# Patient Record
Sex: Female | Born: 1935 | Race: White | Hispanic: No | Smoking: Never smoker
Health system: Southern US, Community
[De-identification: ages and names within clinical notes are randomized; demographics above are authoritative.]

## PROBLEM LIST (undated history)

## (undated) DIAGNOSIS — E7800 Pure hypercholesterolemia, unspecified: Secondary | ICD-10-CM

## (undated) DIAGNOSIS — I10 Essential (primary) hypertension: Secondary | ICD-10-CM

## (undated) DIAGNOSIS — R51 Headache: Secondary | ICD-10-CM

## (undated) DIAGNOSIS — R519 Headache, unspecified: Secondary | ICD-10-CM

## (undated) DIAGNOSIS — C801 Malignant (primary) neoplasm, unspecified: Secondary | ICD-10-CM

## (undated) DIAGNOSIS — J302 Other seasonal allergic rhinitis: Secondary | ICD-10-CM

## (undated) DIAGNOSIS — F329 Major depressive disorder, single episode, unspecified: Secondary | ICD-10-CM

## (undated) DIAGNOSIS — R252 Cramp and spasm: Secondary | ICD-10-CM

## (undated) DIAGNOSIS — H9190 Unspecified hearing loss, unspecified ear: Secondary | ICD-10-CM

## (undated) DIAGNOSIS — Z973 Presence of spectacles and contact lenses: Secondary | ICD-10-CM

## (undated) DIAGNOSIS — F32A Depression, unspecified: Secondary | ICD-10-CM

## (undated) HISTORY — PX: FRACTURE SURGERY: SHX138

## (undated) HISTORY — PX: BREAST SURGERY: SHX581

## (undated) HISTORY — PX: CATARACT EXTRACTION W/ INTRAOCULAR LENS IMPLANT: SHX1309

## (undated) HISTORY — PX: TONSILLECTOMY: SUR1361

---

## 2014-03-29 ENCOUNTER — Encounter (HOSPITAL_COMMUNITY): Payer: Self-pay | Admitting: Pharmacy Technician

## 2014-03-31 ENCOUNTER — Ambulatory Visit (HOSPITAL_COMMUNITY)
Admission: RE | Admit: 2014-03-31 | Discharge: 2014-03-31 | Disposition: A | Discharge status: Home / Self Care | Payer: Medicare Other | Source: Ambulatory Visit | Attending: Orthopedic Surgery | Admitting: Orthopedic Surgery

## 2014-03-31 ENCOUNTER — Encounter (HOSPITAL_COMMUNITY): Payer: Self-pay

## 2014-03-31 ENCOUNTER — Encounter (HOSPITAL_COMMUNITY)
Admission: RE | Admit: 2014-03-31 | Discharge: 2014-03-31 | Disposition: A | Discharge status: Home / Self Care | Payer: Medicare Other | Source: Ambulatory Visit | Attending: Orthopaedic Surgery | Admitting: Orthopaedic Surgery

## 2014-03-31 DIAGNOSIS — M1611 Unilateral primary osteoarthritis, right hip: Secondary | ICD-10-CM | POA: Diagnosis not present

## 2014-03-31 DIAGNOSIS — R8290 Unspecified abnormal findings in urine: Secondary | ICD-10-CM | POA: Diagnosis not present

## 2014-03-31 DIAGNOSIS — Z01818 Encounter for other preprocedural examination: Secondary | ICD-10-CM | POA: Diagnosis present

## 2014-03-31 DIAGNOSIS — I10 Essential (primary) hypertension: Secondary | ICD-10-CM | POA: Insufficient documentation

## 2014-03-31 HISTORY — DX: Malignant (primary) neoplasm, unspecified: C80.1

## 2014-03-31 HISTORY — DX: Headache: R51

## 2014-03-31 HISTORY — DX: Depression, unspecified: F32.A

## 2014-03-31 HISTORY — DX: Essential (primary) hypertension: I10

## 2014-03-31 HISTORY — DX: Other seasonal allergic rhinitis: J30.2

## 2014-03-31 HISTORY — DX: Pure hypercholesterolemia, unspecified: E78.00

## 2014-03-31 HISTORY — DX: Unspecified hearing loss, unspecified ear: H91.90

## 2014-03-31 HISTORY — DX: Major depressive disorder, single episode, unspecified: F32.9

## 2014-03-31 HISTORY — DX: Headache, unspecified: R51.9

## 2014-03-31 HISTORY — DX: Presence of spectacles and contact lenses: Z97.3

## 2014-03-31 LAB — CBC WITH DIFFERENTIAL/PLATELET
BASOS ABS: 0 10*3/uL (ref 0.0–0.1)
BASOS PCT: 0 % (ref 0–1)
Eosinophils Absolute: 0.3 10*3/uL (ref 0.0–0.7)
Eosinophils Relative: 4 % (ref 0–5)
HCT: 40.3 % (ref 36.0–46.0)
HEMOGLOBIN: 13.5 g/dL (ref 12.0–15.0)
Lymphocytes Relative: 19 % (ref 12–46)
Lymphs Abs: 1.4 10*3/uL (ref 0.7–4.0)
MCH: 31.1 pg (ref 26.0–34.0)
MCHC: 33.5 g/dL (ref 30.0–36.0)
MCV: 92.9 fL (ref 78.0–100.0)
Monocytes Absolute: 0.7 10*3/uL (ref 0.1–1.0)
Monocytes Relative: 10 % (ref 3–12)
NEUTROS ABS: 4.8 10*3/uL (ref 1.7–7.7)
Neutrophils Relative %: 67 % (ref 43–77)
Platelets: 289 10*3/uL (ref 150–400)
RBC: 4.34 MIL/uL (ref 3.87–5.11)
RDW: 12.7 % (ref 11.5–15.5)
WBC: 7.1 10*3/uL (ref 4.0–10.5)

## 2014-03-31 LAB — TYPE AND SCREEN
ABO/RH(D): A POS
Antibody Screen: NEGATIVE

## 2014-03-31 LAB — URINALYSIS, ROUTINE W REFLEX MICROSCOPIC
Bilirubin Urine: NEGATIVE
GLUCOSE, UA: NEGATIVE mg/dL
HGB URINE DIPSTICK: NEGATIVE
Ketones, ur: NEGATIVE mg/dL
Nitrite: NEGATIVE
PH: 5.5 (ref 5.0–8.0)
PROTEIN: NEGATIVE mg/dL
Specific Gravity, Urine: 1.023 (ref 1.005–1.030)
Urobilinogen, UA: 1 mg/dL (ref 0.0–1.0)

## 2014-03-31 LAB — SURGICAL PCR SCREEN
MRSA, PCR: NEGATIVE
Staphylococcus aureus: NEGATIVE

## 2014-03-31 LAB — COMPREHENSIVE METABOLIC PANEL
ALK PHOS: 62 U/L (ref 39–117)
ALT: 13 U/L (ref 0–35)
ANION GAP: 15 (ref 5–15)
AST: 20 U/L (ref 0–37)
Albumin: 4.3 g/dL (ref 3.5–5.2)
BILIRUBIN TOTAL: 0.3 mg/dL (ref 0.3–1.2)
BUN: 31 mg/dL — ABNORMAL HIGH (ref 6–23)
CO2: 24 mEq/L (ref 19–32)
Calcium: 9.9 mg/dL (ref 8.4–10.5)
Chloride: 106 mEq/L (ref 96–112)
Creatinine, Ser: 0.57 mg/dL (ref 0.50–1.10)
GFR calc Af Amer: >90 mL/min (ref >90)
GFR calc non Af Amer: 81 mL/min — ABNORMAL LOW (ref >90)
Glucose, Bld: 94 mg/dL (ref 70–99)
POTASSIUM: 4 meq/L (ref 3.7–5.3)
Sodium: 145 mEq/L (ref 137–147)
TOTAL PROTEIN: 7.4 g/dL (ref 6.0–8.3)

## 2014-03-31 LAB — PROTIME-INR
INR: 1.01 (ref 0.00–1.49)
Prothrombin Time: 13.4 seconds (ref 11.6–15.2)

## 2014-03-31 LAB — APTT: APTT: 30 s (ref 24–37)

## 2014-03-31 LAB — URINE MICROSCOPIC-ADD ON

## 2014-03-31 LAB — ABO/RH: ABO/RH(D): A POS

## 2014-03-31 IMAGING — CR DG CHEST 2V
2 series · 2 of 2 positions shown · non-contrast
Comparison: None.

CLINICAL DATA: Preop total right hip arthroplasty. History of
hypertension.

EXAM:
CHEST  2 VIEW

[w chest pa]
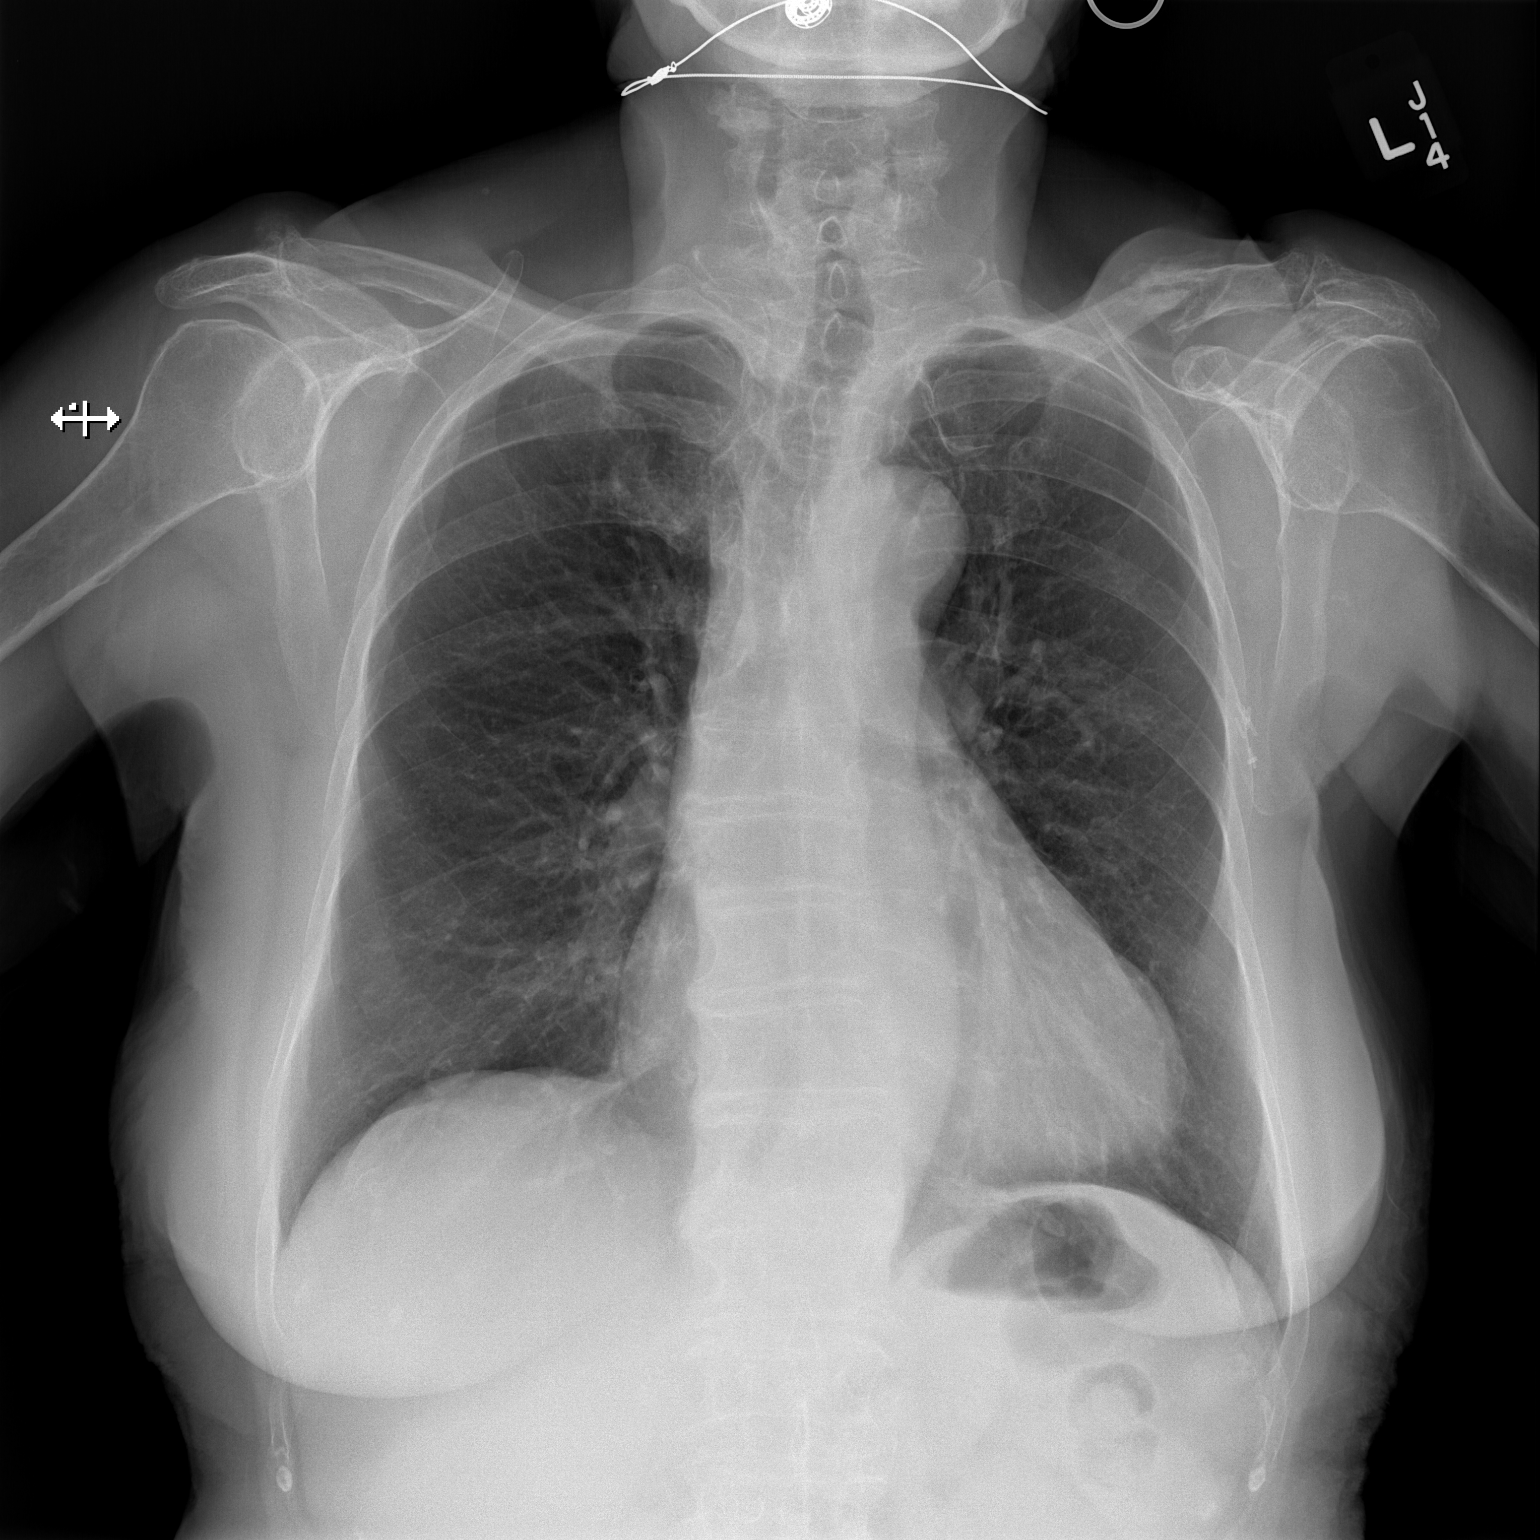

[w chest lat]
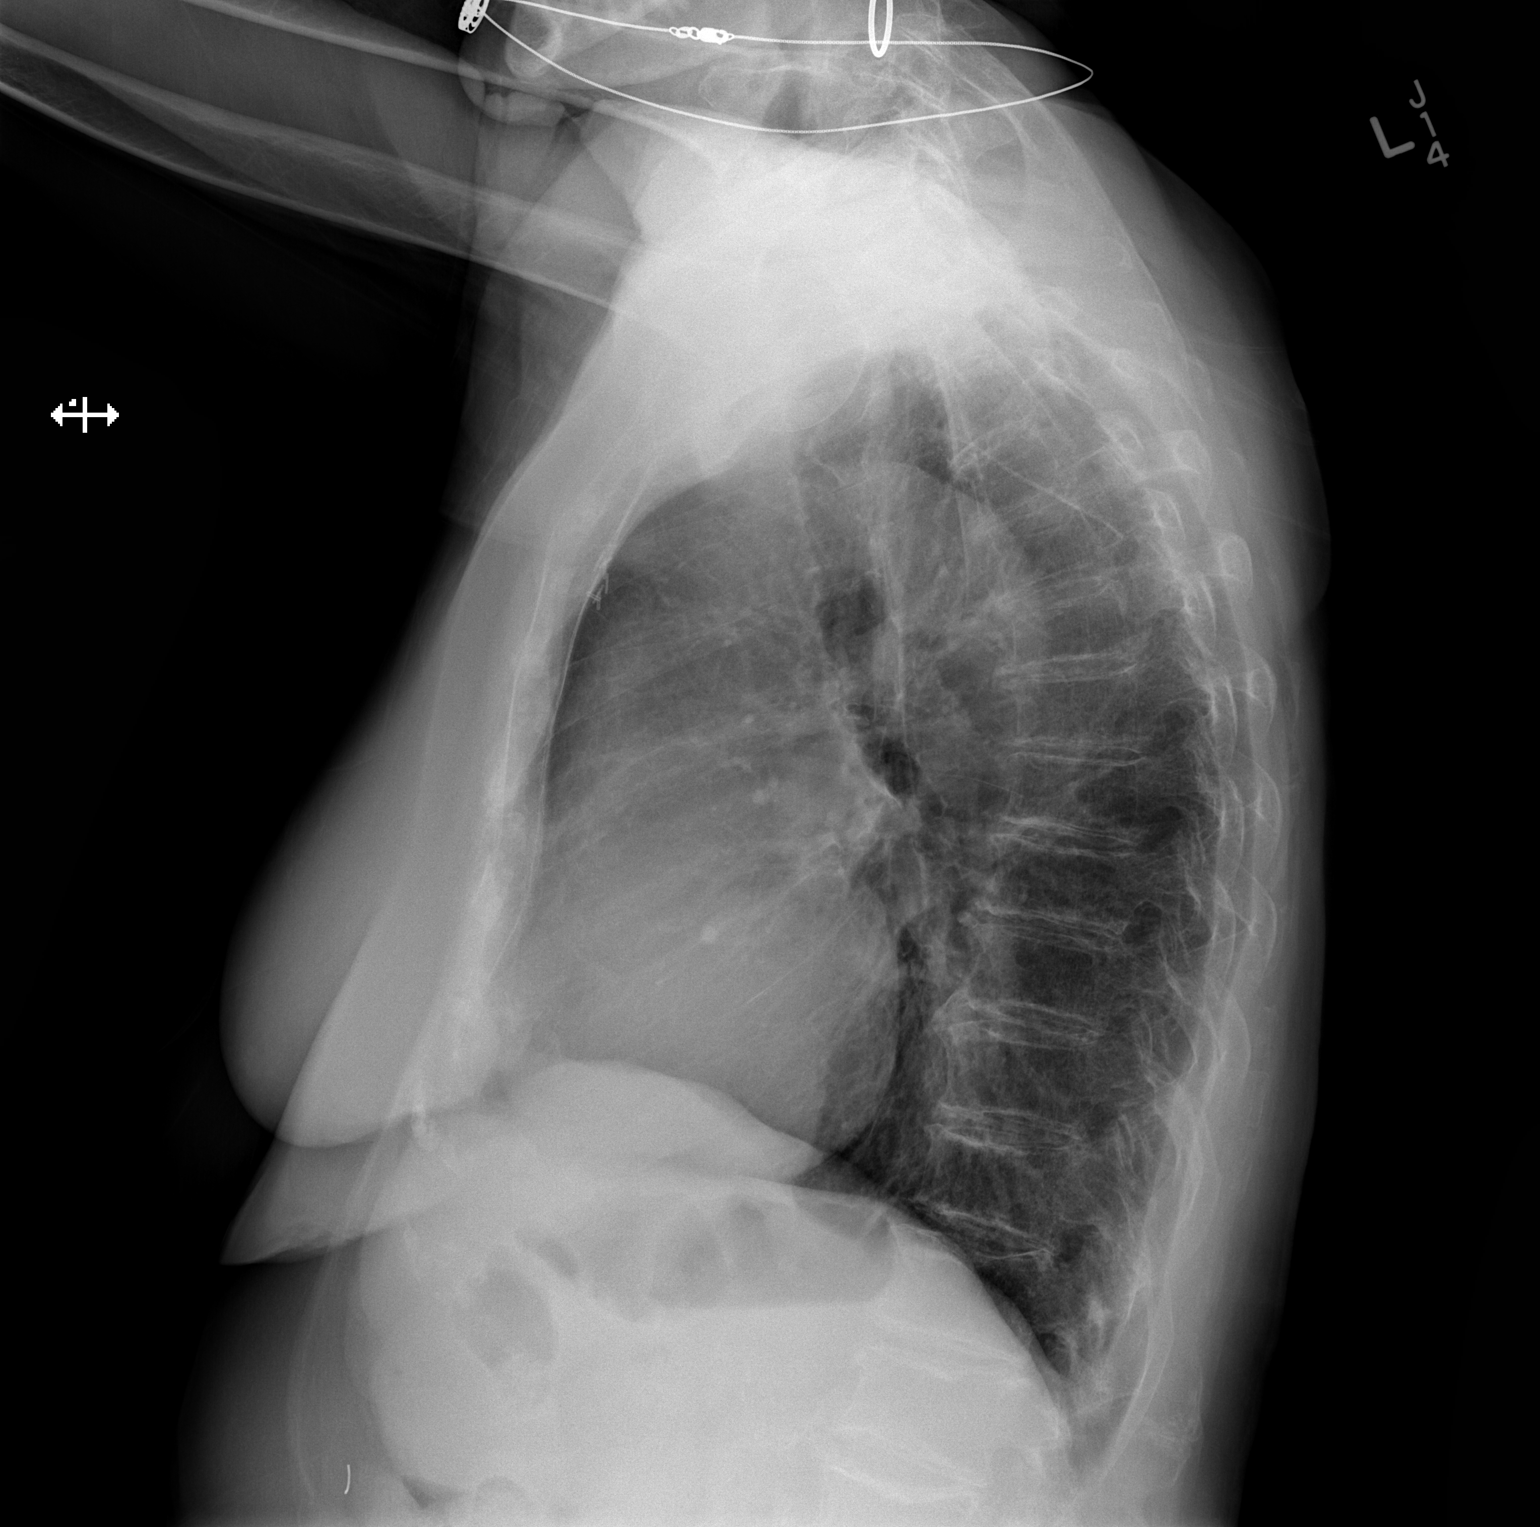

[2 of 2 positions shown; findings below may reference images not displayed]

FINDINGS: Heart is mildly enlarged. Lungs are clear. No effusions or edema. No
acute bony abnormality. Old left clavicle fracture. Degenerative
changes in the shoulders and lower thoracic spine. Surgical clips in
the left axilla.
IMPRESSION: Cardiomegaly.  No active disease.

## 2014-03-31 NOTE — Pre-Procedure Instructions (Signed)
Salvador Bigbee  03/31/2014   Your procedure is scheduled on: Tuesday, April 06, 2014  Report to [facility] Admitting at 5:30 AM.  Call this number if you have problems the morning of surgery: [phone]   Remember:   Do not eat food or drink liquids after midnight Monday, April 05, 2014   Take these medicines the morning of surgery with A SIP OF WATER: FLUoxetine (PROZAC)  Stop taking Aspirin, , and herbal medications. Do not take any NSAIDs ie: Ibuprofen, Advil, Naproxen or any medication containing Aspirin.   Do not wear jewelry, make-up or nail polish.  Do not wear lotions, powders, or perfumes. You may not wear deodorant.  Do not shave 48 hours prior to surgery.  Do not bring valuables to the hospital.  [facility] is not responsible  for any belongings or valuables.               Contacts, dentures or bridgework may not be worn into surgery.  Leave suitcase in the car. After surgery it may be brought to your room.  For patients admitted to the hospital, discharge time is determined by your treatment team.               Patients discharged the day of surgery will not be allowed to drive home.  Name and phone number of your driver:   Special Instructions:  Special Instructions:Special Instructions: [facility] - Preparing for Surgery  Before surgery, you can play an important role.  Because skin is not sterile, your skin needs to be as free of germs as possible.  You can reduce the number of germs on you skin by washing with CHG (chlorahexidine gluconate) soap before surgery.  CHG is an antiseptic cleaner which kills germs and bonds with the skin to continue killing germs even after washing.  Please DO NOT use if you have an allergy to CHG or antibacterial soaps.  If your skin becomes reddened/irritated stop using the CHG and inform your nurse when you arrive at Short Stay.  Do not shave (including legs and underarms) for at least 48 hours prior to the first CHG  shower.  You may shave your face.  Please follow these instructions carefully:   1.  Shower with CHG Soap the night before surgery and the morning of Surgery.  2.  If you choose to wash your hair, wash your hair first as usual with your normal shampoo.  3.  After you shampoo, rinse your hair and body thoroughly to remove the Shampoo.  4.  Use CHG as you would any other liquid soap.  You can apply chg directly  to the skin and wash gently with scrungie or a clean washcloth.  5.  Apply the CHG Soap to your body ONLY FROM THE NECK DOWN.  Do not use on open wounds or open sores.  Avoid contact with your eyes, ears, mouth and genitals (private parts).  Wash genitals (private parts) with your normal soap.  6.  Wash thoroughly, paying special attention to the area where your surgery will be performed.  7.  Thoroughly rinse your body with warm water from the neck down.  8.  DO NOT shower/wash with your normal soap after using and rinsing off the CHG Soap.  9.  Pat yourself dry with a clean towel.            10.  Wear clean pajamas.  11.  Place clean sheets on your bed the night of your first shower and do not sleep with pets.  Day of Surgery  Do not apply any lotions/deodorants the morning of surgery.  Please wear clean clothes to the hospital/surgery center.   Please read over the following fact sheets that you were given: Pain Booklet, Coughing and Deep Breathing, Blood Transfusion Information, MRSA Information and Surgical Site Infection Prevention
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
## 2014-04-02 LAB — URINE CULTURE
Colony Count: NO GROWTH
Culture: NO GROWTH

## 2014-04-05 MED ORDER — CEFAZOLIN SODIUM-DEXTROSE 2-3 GM-% IV SOLR
2.0000 g | INTRAVENOUS | Status: AC
  Administered 2014-04-06: 2 g via INTRAVENOUS
  Filled 2014-04-05: qty 50

## 2014-04-05 MED ORDER — CHLORHEXIDINE GLUCONATE 4 % EX LIQD
60.0000 mL | Freq: Once | CUTANEOUS | Status: DC
  Filled 2014-04-05: qty 60

## 2014-04-05 MED ORDER — ACETAMINOPHEN 10 MG/ML IV SOLN
1000.0000 mg | Freq: Four times a day (QID) | INTRAVENOUS | Status: DC
Start: 2014-04-06 — End: 2014-04-06
  Administered 2014-04-06: 1000 mg via INTRAVENOUS

## 2014-04-05 NOTE — Progress Notes (Signed)
Anesthesia Chart Review:  Pt is 80 year old female scheduled for R total hip replacement with hardware removal on 04/06/14 with Dr. Durward Fortes.   PMH: HTN, hypercholesterolemia, breast cancer, depression, hard of hearing.   Medications include: ASA, amlodipine, lisinopril, prozac, trazodone  Preoperative labs reviewed.  BUN 31, Cr 0.57  Chest x-ray reviewed. Cardiomegaly. No active disease.  EKG: not done at PAT.    Pt has medical clearance from PCP Lonia Mad in [place], [place]. Notes from his office include documentation of an EKG (but no tracing provided) dated 10/26/2012. Notes state EKG shows L atrial abnormality, RSR prime in V1 or V2 Right VCD or RVH. LVH. Borderline repolarization abnormality.    Pt will need EKG DOS.   Willeen Cass, FNP-BC [facility] Short Stay Surgical Center/Anesthesiology Phone: [phone] 04/05/2014 2:05 PM

## 2014-04-06 ENCOUNTER — Inpatient Hospital Stay (HOSPITAL_COMMUNITY): Payer: Medicare Other

## 2014-04-06 ENCOUNTER — Inpatient Hospital Stay (HOSPITAL_COMMUNITY): Payer: Medicare Other | Admitting: Certified Registered Nurse Anesthetist

## 2014-04-06 ENCOUNTER — Encounter (HOSPITAL_COMMUNITY): Payer: Self-pay | Admitting: *Deleted

## 2014-04-06 ENCOUNTER — Encounter (HOSPITAL_COMMUNITY): Payer: Medicare Other | Admitting: Emergency Medicine

## 2014-04-06 ENCOUNTER — Encounter (HOSPITAL_COMMUNITY)
Admission: RE | Disposition: A | Discharge status: Skilled Nursing Facility | Payer: Self-pay | Source: Ambulatory Visit | Attending: Orthopaedic Surgery

## 2014-04-06 ENCOUNTER — Inpatient Hospital Stay (HOSPITAL_COMMUNITY)
Admission: RE | Admit: 2014-04-06 | Discharge: 2014-04-08 | DRG: 470 | Disposition: A | Discharge status: Skilled Nursing Facility | Payer: Medicare Other | Source: Ambulatory Visit | Attending: Orthopaedic Surgery | Admitting: Orthopaedic Surgery

## 2014-04-06 DIAGNOSIS — Z791 Long term (current) use of non-steroidal anti-inflammatories (NSAID): Secondary | ICD-10-CM

## 2014-04-06 DIAGNOSIS — C50919 Malignant neoplasm of unspecified site of unspecified female breast: Secondary | ICD-10-CM | POA: Diagnosis not present

## 2014-04-06 DIAGNOSIS — Z79899 Other long term (current) drug therapy: Secondary | ICD-10-CM

## 2014-04-06 DIAGNOSIS — Z96649 Presence of unspecified artificial hip joint: Secondary | ICD-10-CM

## 2014-04-06 DIAGNOSIS — Z885 Allergy status to narcotic agent status: Secondary | ICD-10-CM | POA: Diagnosis not present

## 2014-04-06 DIAGNOSIS — Z881 Allergy status to other antibiotic agents status: Secondary | ICD-10-CM

## 2014-04-06 DIAGNOSIS — Z7982 Long term (current) use of aspirin: Secondary | ICD-10-CM

## 2014-04-06 DIAGNOSIS — I10 Essential (primary) hypertension: Secondary | ICD-10-CM | POA: Diagnosis present

## 2014-04-06 DIAGNOSIS — F329 Major depressive disorder, single episode, unspecified: Secondary | ICD-10-CM | POA: Diagnosis present

## 2014-04-06 DIAGNOSIS — Z923 Personal history of irradiation: Secondary | ICD-10-CM

## 2014-04-06 DIAGNOSIS — D62 Acute posthemorrhagic anemia: Secondary | ICD-10-CM | POA: Diagnosis not present

## 2014-04-06 DIAGNOSIS — T8489XA Other specified complication of internal orthopedic prosthetic devices, implants and grafts, initial encounter: Secondary | ICD-10-CM | POA: Diagnosis present

## 2014-04-06 DIAGNOSIS — M1611 Unilateral primary osteoarthritis, right hip: Secondary | ICD-10-CM | POA: Diagnosis not present

## 2014-04-06 DIAGNOSIS — Z853 Personal history of malignant neoplasm of breast: Secondary | ICD-10-CM | POA: Diagnosis not present

## 2014-04-06 DIAGNOSIS — I959 Hypotension, unspecified: Secondary | ICD-10-CM | POA: Diagnosis present

## 2014-04-06 HISTORY — DX: Cramp and spasm: R25.2

## 2014-04-06 HISTORY — PX: TOTAL HIP ARTHROPLASTY: SHX124

## 2014-04-06 HISTORY — PX: HARDWARE REMOVAL: SHX979

## 2014-04-06 SURGERY — ARTHROPLASTY, HIP, TOTAL,POSTERIOR APPROACH
Anesthesia: General | Site: Hip | Laterality: Right

## 2014-04-06 MED ORDER — SODIUM CHLORIDE 0.9 % IR SOLN
Status: DC | PRN
Start: 2014-04-06 — End: 2014-04-06
  Administered 2014-04-06: 1000 mL

## 2014-04-06 MED ORDER — TRAZODONE HCL 50 MG PO TABS
50.0000 mg | ORAL_TABLET | Freq: Every day | ORAL | Status: DC
  Administered 2014-04-07: 50 mg via ORAL
  Filled 2014-04-06 (×3): qty 1

## 2014-04-06 MED ORDER — CEFAZOLIN SODIUM-DEXTROSE 2-3 GM-% IV SOLR
2.0000 g | Freq: Four times a day (QID) | INTRAVENOUS | Status: AC
Start: 2014-04-06 — End: 2014-04-06
  Administered 2014-04-06 (×2): 2 g via INTRAVENOUS
  Filled 2014-04-06 (×2): qty 50

## 2014-04-06 MED ORDER — SODIUM CHLORIDE 0.9 % IV SOLN: INTRAVENOUS | Status: DC

## 2014-04-06 MED ORDER — PHENYLEPHRINE HCL 10 MG/ML IJ SOLN
INTRAMUSCULAR | Status: DC | PRN
  Administered 2014-04-06 (×2): 80 ug via INTRAVENOUS

## 2014-04-06 MED ORDER — FENTANYL CITRATE 0.05 MG/ML IJ SOLN
INTRAMUSCULAR | Status: AC
  Filled 2014-04-06: qty 2

## 2014-04-06 MED ORDER — ACETAMINOPHEN 10 MG/ML IV SOLN
1000.0000 mg | Freq: Four times a day (QID) | INTRAVENOUS | Status: AC
  Administered 2014-04-06 – 2014-04-07 (×4): 1000 mg via INTRAVENOUS
  Filled 2014-04-06 (×4): qty 100

## 2014-04-06 MED ORDER — ARTIFICIAL TEARS OP OINT
TOPICAL_OINTMENT | OPHTHALMIC | Status: AC
  Filled 2014-04-06: qty 3.5

## 2014-04-06 MED ORDER — EPHEDRINE SULFATE 50 MG/ML IJ SOLN
INTRAMUSCULAR | Status: DC | PRN
  Administered 2014-04-06: 5 mg via INTRAVENOUS
  Administered 2014-04-06: 10 mg via INTRAVENOUS
  Administered 2014-04-06: 5 mg via INTRAVENOUS

## 2014-04-06 MED ORDER — KETOROLAC TROMETHAMINE 30 MG/ML IJ SOLN
INTRAMUSCULAR | Status: DC | PRN
  Administered 2014-04-06: 15 mg via INTRAVENOUS

## 2014-04-06 MED ORDER — FENTANYL CITRATE 0.05 MG/ML IJ SOLN
INTRAMUSCULAR | Status: DC | PRN
  Administered 2014-04-06: 100 ug via INTRAVENOUS
  Administered 2014-04-06: 150 ug via INTRAVENOUS
  Administered 2014-04-06: 25 ug via INTRAVENOUS

## 2014-04-06 MED ORDER — PROPOFOL 10 MG/ML IV BOLUS
INTRAVENOUS | Status: AC
Start: 2014-04-06 — End: 2014-04-06
  Filled 2014-04-06: qty 20

## 2014-04-06 MED ORDER — GLYCOPYRROLATE 0.2 MG/ML IJ SOLN
INTRAMUSCULAR | Status: DC | PRN
  Administered 2014-04-06: .4 mg via INTRAVENOUS

## 2014-04-06 MED ORDER — FENTANYL CITRATE 0.05 MG/ML IJ SOLN
INTRAMUSCULAR | Status: AC
  Filled 2014-04-06: qty 5

## 2014-04-06 MED ORDER — METOCLOPRAMIDE HCL 5 MG/ML IJ SOLN: 5.0000 mg | Freq: Three times a day (TID) | INTRAMUSCULAR | Status: DC | PRN

## 2014-04-06 MED ORDER — LACTATED RINGERS IV SOLN
INTRAVENOUS | Status: DC | PRN
  Administered 2014-04-06 (×2): via INTRAVENOUS

## 2014-04-06 MED ORDER — ONDANSETRON HCL 4 MG/2ML IJ SOLN
INTRAMUSCULAR | Status: DC | PRN
  Administered 2014-04-06: 4 mg via INTRAVENOUS

## 2014-04-06 MED ORDER — PHENYLEPHRINE 40 MCG/ML (10ML) SYRINGE FOR IV PUSH (FOR BLOOD PRESSURE SUPPORT)
PREFILLED_SYRINGE | INTRAVENOUS | Status: AC
  Filled 2014-04-06: qty 10

## 2014-04-06 MED ORDER — SODIUM CHLORIDE 0.9 % IJ SOLN
INTRAMUSCULAR | Status: AC
  Filled 2014-04-06: qty 10

## 2014-04-06 MED ORDER — MAGNESIUM CITRATE PO SOLN: 1.0000 | Freq: Once | ORAL | Status: AC | PRN

## 2014-04-06 MED ORDER — DOCUSATE SODIUM 100 MG PO CAPS
100.0000 mg | ORAL_CAPSULE | Freq: Two times a day (BID) | ORAL | Status: DC
  Administered 2014-04-06 – 2014-04-08 (×4): 100 mg via ORAL
  Filled 2014-04-06 (×4): qty 1

## 2014-04-06 MED ORDER — LIDOCAINE HCL (CARDIAC) 20 MG/ML IV SOLN
INTRAVENOUS | Status: AC
Start: 2014-04-06 — End: 2014-04-06
  Filled 2014-04-06: qty 5

## 2014-04-06 MED ORDER — ACETAMINOPHEN 10 MG/ML IV SOLN
INTRAVENOUS | Status: AC
  Filled 2014-04-06: qty 100

## 2014-04-06 MED ORDER — METHOCARBAMOL 500 MG PO TABS
500.0000 mg | ORAL_TABLET | Freq: Four times a day (QID) | ORAL | Status: DC | PRN
  Administered 2014-04-07 – 2014-04-08 (×2): 500 mg via ORAL
  Filled 2014-04-06 (×2): qty 1

## 2014-04-06 MED ORDER — FENTANYL CITRATE 0.05 MG/ML IJ SOLN
25.0000 ug | INTRAMUSCULAR | Status: DC | PRN
  Administered 2014-04-06: 25 ug via INTRAVENOUS

## 2014-04-06 MED ORDER — FENOFIBRATE 160 MG PO TABS
160.0000 mg | ORAL_TABLET | Freq: Every day | ORAL | Status: DC
  Administered 2014-04-06 – 2014-04-08 (×3): 160 mg via ORAL
  Filled 2014-04-06 (×3): qty 1

## 2014-04-06 MED ORDER — KETOROLAC TROMETHAMINE 15 MG/ML IJ SOLN
15.0000 mg | Freq: Four times a day (QID) | INTRAMUSCULAR | Status: AC
  Administered 2014-04-06 – 2014-04-07 (×2): 15 mg via INTRAVENOUS
  Filled 2014-04-06 (×2): qty 1

## 2014-04-06 MED ORDER — ONDANSETRON HCL 4 MG/2ML IJ SOLN: 4.0000 mg | Freq: Four times a day (QID) | INTRAMUSCULAR | Status: DC | PRN

## 2014-04-06 MED ORDER — LISINOPRIL 10 MG PO TABS
10.0000 mg | ORAL_TABLET | Freq: Every day | ORAL | Status: DC
  Administered 2014-04-07 – 2014-04-08 (×2): 10 mg via ORAL
  Filled 2014-04-06 (×2): qty 1

## 2014-04-06 MED ORDER — PHENOL 1.4 % MT LIQD: 1.0000 | OROMUCOSAL | Status: DC | PRN

## 2014-04-06 MED ORDER — SENNOSIDES-DOCUSATE SODIUM 8.6-50 MG PO TABS
1.0000 | ORAL_TABLET | Freq: Every evening | ORAL | Status: DC | PRN
Start: 2014-04-06 — End: 2014-04-08

## 2014-04-06 MED ORDER — ROCURONIUM BROMIDE 50 MG/5ML IV SOLN
INTRAVENOUS | Status: AC
  Filled 2014-04-06: qty 1

## 2014-04-06 MED ORDER — EPHEDRINE SULFATE 50 MG/ML IJ SOLN
INTRAMUSCULAR | Status: AC
  Filled 2014-04-06: qty 1

## 2014-04-06 MED ORDER — LIDOCAINE HCL (CARDIAC) 20 MG/ML IV SOLN
INTRAVENOUS | Status: DC | PRN
  Administered 2014-04-06: 20 mg via INTRAVENOUS

## 2014-04-06 MED ORDER — PHENYLEPHRINE HCL 10 MG/ML IJ SOLN
10.0000 mg | INTRAVENOUS | Status: DC | PRN
  Administered 2014-04-06: 20 ug/min via INTRAVENOUS

## 2014-04-06 MED ORDER — NEOSTIGMINE METHYLSULFATE 10 MG/10ML IV SOLN
INTRAVENOUS | Status: AC
Start: 2014-04-06 — End: 2014-04-06
  Filled 2014-04-06: qty 1

## 2014-04-06 MED ORDER — PROPOFOL 10 MG/ML IV BOLUS
INTRAVENOUS | Status: AC
  Filled 2014-04-06: qty 20

## 2014-04-06 MED ORDER — BISACODYL 5 MG PO TBEC
5.0000 mg | DELAYED_RELEASE_TABLET | Freq: Every day | ORAL | Status: DC | PRN
  Administered 2014-04-08: 5 mg via ORAL
  Filled 2014-04-06: qty 1

## 2014-04-06 MED ORDER — THROMBIN 20000 UNITS EX KIT
PACK | CUTANEOUS | Status: AC
  Filled 2014-04-06: qty 1

## 2014-04-06 MED ORDER — OXYCODONE HCL 5 MG PO TABS
5.0000 mg | ORAL_TABLET | ORAL | Status: DC | PRN
  Administered 2014-04-06: 10 mg via ORAL
  Administered 2014-04-07: 5 mg via ORAL
  Administered 2014-04-07: 10 mg via ORAL
  Administered 2014-04-08 (×3): 5 mg via ORAL
  Filled 2014-04-06: qty 2
  Filled 2014-04-06 (×2): qty 1
  Filled 2014-04-06: qty 2
  Filled 2014-04-06 (×2): qty 1

## 2014-04-06 MED ORDER — ONDANSETRON HCL 4 MG PO TABS: 4.0000 mg | ORAL_TABLET | Freq: Four times a day (QID) | ORAL | Status: DC | PRN

## 2014-04-06 MED ORDER — PROPOFOL 10 MG/ML IV BOLUS
INTRAVENOUS | Status: DC | PRN
  Administered 2014-04-06: 100 mg via INTRAVENOUS

## 2014-04-06 MED ORDER — ROCURONIUM BROMIDE 100 MG/10ML IV SOLN
INTRAVENOUS | Status: DC | PRN
  Administered 2014-04-06: 10 mg via INTRAVENOUS
  Administered 2014-04-06: 40 mg via INTRAVENOUS

## 2014-04-06 MED ORDER — NEOSTIGMINE METHYLSULFATE 10 MG/10ML IV SOLN
INTRAVENOUS | Status: DC | PRN
  Administered 2014-04-06: 3 mg via INTRAVENOUS

## 2014-04-06 MED ORDER — ALUM & MAG HYDROXIDE-SIMETH 200-200-20 MG/5ML PO SUSP: 30.0000 mL | ORAL | Status: DC | PRN

## 2014-04-06 MED ORDER — SUCCINYLCHOLINE CHLORIDE 20 MG/ML IJ SOLN
INTRAMUSCULAR | Status: AC
  Filled 2014-04-06: qty 1

## 2014-04-06 MED ORDER — ARTIFICIAL TEARS OP OINT
TOPICAL_OINTMENT | OPHTHALMIC | Status: DC | PRN
  Administered 2014-04-06: 1 via OPHTHALMIC

## 2014-04-06 MED ORDER — FLUOXETINE HCL 20 MG PO CAPS
20.0000 mg | ORAL_CAPSULE | Freq: Every day | ORAL | Status: DC
  Administered 2014-04-06 – 2014-04-08 (×3): 20 mg via ORAL
  Filled 2014-04-06 (×3): qty 1

## 2014-04-06 MED ORDER — ONDANSETRON HCL 4 MG/2ML IJ SOLN
INTRAMUSCULAR | Status: AC
  Filled 2014-04-06: qty 2

## 2014-04-06 MED ORDER — THROMBIN 20000 UNITS EX KIT
PACK | CUTANEOUS | Status: DC | PRN
  Administered 2014-04-06: 20000 [IU] via TOPICAL

## 2014-04-06 MED ORDER — GLYCOPYRROLATE 0.2 MG/ML IJ SOLN
INTRAMUSCULAR | Status: AC
  Filled 2014-04-06: qty 2

## 2014-04-06 MED ORDER — AMLODIPINE BESYLATE 5 MG PO TABS
5.0000 mg | ORAL_TABLET | Freq: Every day | ORAL | Status: DC
  Administered 2014-04-07: 5 mg via ORAL
  Filled 2014-04-06 (×3): qty 1

## 2014-04-06 MED ORDER — MENTHOL 3 MG MT LOZG: 1.0000 | LOZENGE | OROMUCOSAL | Status: DC | PRN

## 2014-04-06 MED ORDER — MIDAZOLAM HCL 2 MG/2ML IJ SOLN
INTRAMUSCULAR | Status: AC
  Filled 2014-04-06: qty 2

## 2014-04-06 MED ORDER — METHOCARBAMOL 1000 MG/10ML IJ SOLN
500.0000 mg | Freq: Four times a day (QID) | INTRAMUSCULAR | Status: DC | PRN
  Filled 2014-04-06: qty 5

## 2014-04-06 MED ORDER — METOCLOPRAMIDE HCL 10 MG PO TABS: 5.0000 mg | ORAL_TABLET | Freq: Three times a day (TID) | ORAL | Status: DC | PRN

## 2014-04-06 MED ORDER — SODIUM CHLORIDE 0.9 % IV SOLN: 75.0000 mL/h | INTRAVENOUS | Status: DC

## 2014-04-06 MED ORDER — CHLORHEXIDINE GLUCONATE 4 % EX LIQD
60.0000 mL | Freq: Every day | CUTANEOUS | Status: DC
  Filled 2014-04-06: qty 60

## 2014-04-06 MED ORDER — KETOROLAC TROMETHAMINE 30 MG/ML IJ SOLN
INTRAMUSCULAR | Status: AC
  Filled 2014-04-06: qty 1

## 2014-04-06 MED ORDER — RIVAROXABAN 10 MG PO TABS
10.0000 mg | ORAL_TABLET | ORAL | Status: DC
  Administered 2014-04-07: 10 mg via ORAL
  Filled 2014-04-06 (×3): qty 1

## 2014-04-06 MED ORDER — POTASSIUM CHLORIDE CRYS ER 10 MEQ PO TBCR
10.0000 meq | EXTENDED_RELEASE_TABLET | Freq: Every day | ORAL | Status: DC
  Administered 2014-04-06 – 2014-04-08 (×3): 10 meq via ORAL
  Filled 2014-04-06 (×3): qty 1

## 2014-04-06 SURGICAL SUPPLY — 60 items
BLADE SAW SAG 73X25 THK (BLADE) ×2
BLADE SAW SGTL 73X25 THK (BLADE) ×1 IMPLANT
BRUSH FEMORAL CANAL (MISCELLANEOUS) IMPLANT
CAPT HIP PF MOP ×3 IMPLANT
COVER BACK TABLE 24X17X13 BIG (DRAPES) IMPLANT
COVER SURGICAL LIGHT HANDLE (MISCELLANEOUS) ×3 IMPLANT
DRAPE INCISE IOBAN 66X45 STRL (DRAPES) IMPLANT
DRAPE ORTHO SPLIT 77X108 STRL (DRAPES) ×4
DRAPE SURG ORHT 6 SPLT 77X108 (DRAPES) ×2 IMPLANT
DRSG MEPILEX BORDER 4X12 (GAUZE/BANDAGES/DRESSINGS) ×3 IMPLANT
DRSG MEPILEX BORDER 4X8 (GAUZE/BANDAGES/DRESSINGS) ×3 IMPLANT
DURAPREP 26ML APPLICATOR (WOUND CARE) ×6 IMPLANT
ELECT BLADE 4.0 EZ CLEAN MEGAD (MISCELLANEOUS) ×3
ELECT BLADE 6.5 EXT (BLADE) IMPLANT
ELECT REM PT RETURN 9FT ADLT (ELECTROSURGICAL) ×3
ELECTRODE BLDE 4.0 EZ CLN MEGD (MISCELLANEOUS) ×1 IMPLANT
ELECTRODE REM PT RTRN 9FT ADLT (ELECTROSURGICAL) ×1 IMPLANT
EVACUATOR 1/8 PVC DRAIN (DRAIN) IMPLANT
FACESHIELD WRAPAROUND (MASK) ×9 IMPLANT
GLOVE BIOGEL PI IND STRL 8 (GLOVE) ×2 IMPLANT
GLOVE BIOGEL PI IND STRL 8.5 (GLOVE) ×1 IMPLANT
GLOVE BIOGEL PI INDICATOR 8 (GLOVE) ×4
GLOVE BIOGEL PI INDICATOR 8.5 (GLOVE) ×2
GLOVE ECLIPSE 8.0 STRL XLNG CF (GLOVE) ×3 IMPLANT
GLOVE SURG ORTHO 8.5 STRL (GLOVE) ×6 IMPLANT
GOWN STRL REUS W/ TWL LRG LVL3 (GOWN DISPOSABLE) ×2 IMPLANT
GOWN STRL REUS W/TWL 2XL LVL3 (GOWN DISPOSABLE) ×6 IMPLANT
GOWN STRL REUS W/TWL LRG LVL3 (GOWN DISPOSABLE) ×4
HANDPIECE INTERPULSE COAX TIP (DISPOSABLE)
IMMOBILIZER KNEE 20 (SOFTGOODS) IMPLANT
IMMOBILIZER KNEE 22 UNIV (SOFTGOODS) ×3 IMPLANT
IMMOBILIZER KNEE 24 THIGH 36 (MISCELLANEOUS) IMPLANT
IMMOBILIZER KNEE 24 UNIV (MISCELLANEOUS)
KIT BASIN OR (CUSTOM PROCEDURE TRAY) ×3 IMPLANT
KIT ROOM TURNOVER OR (KITS) ×3 IMPLANT
MANIFOLD NEPTUNE II (INSTRUMENTS) ×3 IMPLANT
NEEDLE 22X1 1/2 (OR ONLY) (NEEDLE) ×3 IMPLANT
NS IRRIG 1000ML POUR BTL (IV SOLUTION) ×3 IMPLANT
PACK TOTAL JOINT (CUSTOM PROCEDURE TRAY) ×3 IMPLANT
PAD ARMBOARD 7.5X6 YLW CONV (MISCELLANEOUS) ×6 IMPLANT
PRESSURIZER FEMORAL UNIV (MISCELLANEOUS) IMPLANT
SCREW 6.5MMX25MM (Screw) IMPLANT
SET HNDPC FAN SPRY TIP SCT (DISPOSABLE) IMPLANT
STAPLER VISISTAT 35W (STAPLE) ×3 IMPLANT
SUCTION FRAZIER TIP 10 FR DISP (SUCTIONS) ×3 IMPLANT
SUT BONE WAX W31G (SUTURE) ×3 IMPLANT
SUT ETHIBOND NAB CT1 #1 30IN (SUTURE) ×9 IMPLANT
SUT MNCRL AB 3-0 PS2 18 (SUTURE) ×3 IMPLANT
SUT VIC AB 0 CT1 27 (SUTURE) ×4
SUT VIC AB 0 CT1 27XBRD ANBCTR (SUTURE) ×2 IMPLANT
SUT VIC AB 1 CT1 27 (SUTURE) ×4
SUT VIC AB 1 CT1 27XBRD ANBCTR (SUTURE) ×2 IMPLANT
SUT VIC AB 2-0 CT1 27 (SUTURE) ×2
SUT VIC AB 2-0 CT1 TAPERPNT 27 (SUTURE) ×1 IMPLANT
SYR CONTROL 10ML LL (SYRINGE) ×3 IMPLANT
TOWEL OR 17X24 6PK STRL BLUE (TOWEL DISPOSABLE) ×3 IMPLANT
TOWEL OR 17X26 10 PK STRL BLUE (TOWEL DISPOSABLE) ×3 IMPLANT
TOWER CARTRIDGE SMART MIX (DISPOSABLE) IMPLANT
TRAY FOLEY CATH 16FRSI W/METER (SET/KITS/TRAYS/PACK) ×3 IMPLANT
WATER STERILE IRR 1000ML POUR (IV SOLUTION) ×12 IMPLANT

## 2014-04-06 NOTE — Anesthesia Postprocedure Evaluation (Signed)
  Anesthesia Post-op Note  Patient: Kayla Carr  Procedure(s) Performed: Procedure(s): TOTAL HIP ARTHROPLASTY (Right) HARDWARE REMOVAL (Right)  Patient Location: PACU  Anesthesia Type:General  Level of Consciousness: awake, alert , oriented and patient cooperative  Airway and Oxygen Therapy: Patient Spontanous Breathing and Patient connected to nasal cannula oxygen  Post-op Pain: none  Post-op Assessment: Post-op Vital signs reviewed, Patient's Cardiovascular Status Stable, Respiratory Function Stable, Patent Airway, No signs of Nausea or vomiting and Pain level controlled  Post-op Vital Signs: Reviewed and stable  Last Vitals:  Filed Vitals:   04/06/14 1100  BP:   Pulse: 69  Temp: 36.4 C  Resp: 12    Complications: No apparent anesthesia complications

## 2014-04-06 NOTE — Anesthesia Procedure Notes (Signed)
Procedure Name: Intubation Date/Time: 04/06/2014 8:06 AM Performed by: Garner Nash Pre-anesthesia Checklist: Patient identified, Timeout performed, Suction available, Patient being monitored and Emergency Drugs available Patient Re-evaluated:Patient Re-evaluated prior to inductionOxygen Delivery Method: Circle system utilized Preoxygenation: Pre-oxygenation with 100% oxygen Intubation Type: IV induction Ventilation: Mask ventilation without difficulty Laryngoscope Size: Miller and 2 Grade View: Grade I Tube type: Oral Number of attempts: 1 Airway Equipment and Method: Stylet Placement Confirmation: ETT inserted through vocal cords under direct vision,  breath sounds checked- equal and bilateral,  positive ETCO2 and CO2 detector Secured at: 22 cm Tube secured with: Tape Dental Injury: Teeth and Oropharynx as per pre-operative assessment

## 2014-04-06 NOTE — Progress Notes (Signed)
OT Cancellation Note  Patient Details Name: Kayla Carr MRN: [id number] DOB: 03/13/1925   Cancelled Treatment:    Reason Eval/Treat Not Completed: OT screened,.  Pt is Medicare and current D/C plan is SNF. No apparent immediate acute care OT needs, therefore will defer OT to SNF. If OT eval is needed please call Acute Rehab Dept. at [phone] or text page OT at [phone].    Benito Mccreedy OTR/L [phone] 04/06/2014, 4:04 PM

## 2014-04-06 NOTE — Discharge Instructions (Signed)
Information on my medicine - XARELTO (Rivaroxaban)  This medication education was reviewed with me or my healthcare representative as part of my discharge preparation.  The pharmacist that spoke with me during my hospital stay was:  Georgina Peer, [facility]  Why was Xarelto prescribed for you? Xarelto was prescribed for you to reduce the risk of blood clots forming after orthopedic surgery. The medical term for these abnormal blood clots is venous thromboembolism (VTE).  What do you need to know about xarelto ? Take your Xarelto ONCE DAILY at the same time every day. You may take it either with or without food.  If you have difficulty swallowing the tablet whole, you may crush it and mix in applesauce just prior to taking your dose.  Take Xarelto exactly as prescribed by your doctor and DO NOT stop taking Xarelto without talking to the doctor who prescribed the medication.  Stopping without other VTE prevention medication to take the place of Xarelto may increase your risk of developing a clot.  After discharge, you should have regular check-up appointments with your healthcare provider that is prescribing your Xarelto.    What do you do if you miss a dose? If you miss a dose, take it as soon as you remember on the same day then continue your regularly scheduled once daily regimen the next day. Do not take two doses of Xarelto on the same day.   Important Safety Information A possible side effect of Xarelto is bleeding. You should call your healthcare provider right away if you experience any of the following:   Bleeding from an injury or your nose that does not stop.   Unusual colored urine (red or dark brown) or unusual colored stools (red or black).   Unusual bruising for unknown reasons.   A serious fall or if you hit your head (even if there is no bleeding).  Some medicines may interact with Xarelto and might increase your risk of bleeding while on Xarelto. To help avoid  this, consult your healthcare provider or pharmacist prior to using any new prescription or non-prescription medications, including herbals, vitamins, non-steroidal anti-inflammatory drugs (NSAIDs) and supplements.  This website has more information on Xarelto: [email or web address]

## 2014-04-06 NOTE — Op Note (Signed)
PATIENT ID:      Tashawn Greff  MRN:     [id number] DOB/AGE:    Jul 07, 1924 / 80 y.o.       OPERATIVE REPORT    DATE OF PROCEDURE:  04/06/2014       PREOPERATIVE DIAGNOSIS: OSTEOARTHRITIS RIGHT HIP S/P SUBCAPITAL FRACTURE WITH EXTRUDED PIN FIXATION                                                       Estimated body mass index is 28.29 kg/(m^2) as calculated from the following:   Height as of 03/31/14: 5\' 8"  (1.727 m).   Weight as of this encounter: 84.369 kg (186 lb).     POSTOPERATIVE DIAGNOSIS:   SAME                                                                 Estimated body mass index is 28.29 kg/(m^2) as calculated from the following:   Height as of 03/31/14: 5\' 8"  (1.727 m).   Weight as of this encounter: 84.369 kg (186 lb).     PROCEDURE:  REMOVAL OF 4 7.68mm screws and washers AND CONVERSION TO TOTAL HIP ARTHROPLASTY     SURGEON:  Joni Fears, MD    ASSISTANT:   Biagio Borg, PA-C   (Present and scrubbed throughout the case, critical for assistance with exposure, retraction, instrumentation, and closure.)          ANESTHESIA: general     DRAINS: none :      TOURNIQUET TIME: * No tourniquets in log *    COMPLICATIONS:  None   CONDITION:  stable  PROCEDURE IN DETAIL: [id number]   [place], PETER W 04/06/2014, 9:34 AM

## 2014-04-06 NOTE — H&P (Signed)
  The recent History & Physical has been reviewed. I have personally examined the patient today. There is no interval change to the documented History & Physical. The patient would like to proceed with the procedure.  Milburn Freeney W 04/06/2014,  7:11 AM

## 2014-04-06 NOTE — Op Note (Signed)
NAMEBRYAUNA, Kayla Carr               ACCOUNT NO.:  [id number]  MEDICAL RECORD NO.:  [id number]  LOCATION:  [facility]                         FACILITY:  [facility]  PHYSICIAN:  Vonna Kotyk. Phuoc Huy, M.D.DATE OF BIRTH:  22-Oct-1924  DATE OF PROCEDURE:  04/06/2014 DATE OF DISCHARGE:                              OPERATIVE REPORT   PREOPERATIVE DIAGNOSIS:  Osteoarthritis, right hip, status post subcapital fracture x1 year with extruded pin fixation through the femoral head into the acetabulum.  POSTOPERATIVE DIAGNOSIS:  Osteoarthritis, right hip, status post subcapital fracture x1 year with extruded pin fixation through the femoral head into the acetabulum.  PROCEDURE:  Removal of four 7.0-mm screws and washers and conversion to a total hip replacement.  SURGEON:  Vonna Kotyk. Durward Fortes, M.D.  ASSISTANT:  Aaron Edelman D. Petrarca, PA-C was present throughout the operative procedure to ensure its timely completion.  ANESTHESIA:  General.  COMPLICATIONS:  None.  COMPONENTS:  DePuy AML 16.5 mm small stature femoral component, a 1.5 mm neck length, 36 mm outer diameter hip ball, a 52 mm outer diameter. Gription 3 metallic acetabular component with a single 6.5 mm acetabular screw and apex hole eliminator, and the Marathon polyethylene liner with a 10 degree posterior lip.  Components were press-fit.  DESCRIPTION OF PROCEDURE:  Ms. Faulks was met in the holding area, identified the right hip as the appropriate operative site and marked her hip accordingly.  The patient was then transported to room #7 and placed under general anesthesia without difficulty.  Nursing staff inserted a Foley catheter. Urine was clear.  The patient was then placed in the lateral decubitus position with the right side up and secured to the operating table with the Innomed hip system.  Time-out was called.  The right lower extremity was then prepped with chlorhexidine scrub and then DuraPrep from iliac crest to the ankle.   Sterile draping was performed.  A routine __________ incision was utilized, and via sharp dissection, carried down to the subcutaneous tissue.  Small bleeders were Bovie coagulated.  Adipose tissue was incised to the level of the iliotibial band.  This was incised along length of the skin incision.  In the base of the greater trochanter, I could feel the tips of the previously inserted metallic screws, each of the heads was identified and removed with a large hexagonal head screwdriver.  There were 2 washers also attached to 2 of the screws which were removed as well.  The wound was irrigated with saline solution.  I closed the vastus fascia with a running #1 Vicryl.  Incision was then completed to the level of the hip capsule.  I incised the short external rotators and tagged in the tendinous structures.  I could palpate the sciatic nerve throughout the operative procedure and was careful to protect it.  Capsule was incised.  There was a serosanguineous effusion of several millimeters.  The head was then easily dislocated posteriorly.  The head was devoid of articular cartilage.  It was flattened, and there was a large flake of articular cartilage from the superior surface consistent with the extruded pins.  Using the calcar guide, the head was then osteotomized at about a fingerbreadth proximal to  the lesser trochanter.  Capsule was cleared from the piriformis fossa.  Starter hole was then made and reaming was performed to 16 mm to accept a 16.5 component.  Rasping was performed sequentially to 16.5.  It fit very nicely without toggling.  Retractors were then placed about the acetabulum.  There was a moderate amount of soft tissue debris from the extruded pins.  This was removed. Retractors were then placed about the acetabulum and reaming was performed to 51 mm.  I trialed the 50 and the 52 mm acetabular component.  I could completely seat the 50 mm with a good rim fit.  The 52  would not completely seat.  I therefore inserted a 52 mm outer diameter Gription 3 metallic acetabular component with 3 apical holes. This was impacted in place with a nice fit.  I then inserted the trial polyethylene liner with a 10 degree posterior lip.  We inserted the 16.5 femoral rasp and then trialed a 36 mm outer diameter hip ball with a 1.5 mm neck length.  We were very careful about reduction; and after reduction, through full range of motion, we could not dislocate the hip. I thought the leg lengths were just slightly longer than preop.  The patient appeared to be shorter.  I think the leg lengths are now symmetrical.  We could not sublux the hip with any motion.  The trial components were then removed.  I copiously irrigated the acetabulum.  I inserted a single 6.5 mm acetabular screw.  I checked to be sure that it did not extrude.  It was 20 mm in length.  The apex hole eliminator was inserted.  I did spray the acetabulum with thrombin and then inserted the final marathon polyethylene liner with a 10 degree posterior lip.  I thought I had nice dry field.  Retractors then placed about the femur.  We carefully inserted the 16.5 mm AML small stature femoral stem.  It fit nicely in the calcar.  We did notice about a 3 mm crack in the calcar.  It was very small and did not propagate, I thought it was perfectly stable.  Therefore, I did not think it was necessary to secure this.  We then cleared the [facility] taper neck, inserted the metallic hip ball 1.5 mm neck length with 36 mm outer diameter, cleaned the acetabulum, reduced the hip; and again through a full range of motion, it remained perfectly stable.  Wound was again irrigated with saline solution.  We sprayed thrombin spray.  The deep capsule was closed with interrupted #1 Ethibond.  Superficial capsule closed with the same material.  We re-sprayed the more superficial structures with thrombin spray, closed the IT band with  a running #1 Vicryl.  Subcu was closed in several layers of Vicryl and 3-0 Monocryl.  Skin closed with skin clips. Sterile bulky dressing was applied, followed by the patient's support stocking we then placed during the supine position.  She was awoken and returned to the postanesthesia recovery room in satisfactory condition.     Vonna Kotyk. Durward Fortes, M.D.     PWW/MEDQ  D:  04/06/2014  T:  04/06/2014  Job:  [id number]

## 2014-04-06 NOTE — Evaluation (Signed)
Physical Therapy Evaluation Patient Details Name: Kayla Carr MRN: [id number] DOB: Jun 23, 1924 Today's Date: 04/06/2014   History of Present Illness  80 yo female with new R THA after failed ORIF to R hip that had screw migrating into R acetabular articular surface, significant pain and unable to walk.  Clinical Impression  Pt was seen for evaluation with very lethargic presence and could benefit from SNF placement after watching her vitals and movement.  Her daugther is already planning this at pt consent and request.  Will see QD for daily visits.    Follow Up Recommendations SNF;Supervision/Assistance - 24 hour    Equipment Recommendations  None recommended by PT    Recommendations for Other Services       Precautions / Restrictions Precautions Precautions: Fall;Posterior Hip Precaution Booklet Issued: No Precaution Comments: Pt lethargic and unable to focus on instructions Required Braces or Orthoses: Knee Immobilizer - Right (when in bed) Knee Immobilizer - Right: Other (comment) (when in bed to maintain posterior precautions) Restrictions Weight Bearing Restrictions: Yes Other Position/Activity Restrictions: WBAT      Mobility  Bed Mobility Overal bed mobility: Needs Assistance Bed Mobility: Supine to Sit;Sit to Supine     Supine to sit: Mod assist Sit to supine: Max assist   General bed mobility comments: Pt was lethargic coming OOB but very quiet getting back in bed.  More alert once in bed in reclined position  Transfers Overall transfer level: Needs assistance Equipment used: Rolling walker (2 wheeled) Transfers: Sit to/from Stand Sit to Stand: Mod assist         General transfer comment: reluctant to stand on RLE but has been TDWB while awaiting surgery according to her daughter  Ambulation/Gait                Stairs            Wheelchair Mobility    Modified Rankin (Stroke Patients Only)       Balance Overall balance  assessment: Needs assistance Sitting-balance support: Feet supported Sitting balance-Leahy Scale: Poor   Postural control: Posterior lean Standing balance support: Bilateral upper extremity supported Standing balance-Leahy Scale: Poor Standing balance comment: Pt is some what fearful/reluctant to wb on RLE due to her history                             Pertinent Vitals/Pain Pain Assessment: No/denies pain BP was 92/48, pulse 63 and O2 sat 97%.    Home Living Family/patient expects to be discharged to:: Skilled nursing facility                      Prior Function Level of Independence: Independent with assistive device(s) (before surgery )               Hand Dominance        Extremity/Trunk Assessment   Upper Extremity Assessment: Overall WFL for tasks assessed           Lower Extremity Assessment: RLE deficits/detail RLE Deficits / Details: Pt has new THA on R LE with weak general movement and diffculty focusing on movement to initiate her transition in and out of bed    Cervical / Trunk Assessment: Kyphotic  Communication   Communication: No difficulties;HOH  Cognition Arousal/Alertness: Lethargic Behavior During Therapy: Flat affect Overall Cognitive Status: Impaired/Different from baseline Area of Impairment: Safety/judgement;Following commands;Problem solving;Awareness     Memory: Decreased short-term memory;Decreased recall of  precautions Following Commands: Follows one step commands with increased time;Follows one step commands inconsistently Safety/Judgement: Decreased awareness of safety;Decreased awareness of deficits Awareness: Emergent;Anticipatory Problem Solving: Slow processing;Difficulty sequencing;Decreased initiation;Requires verbal cues;Requires tactile cues General Comments: daughter in room who reports pt lives alone and won't have help    General Comments General comments (skin integrity, edema, etc.): Pt is  lethargic but just out of surgery today.  Pain more controlled due to her recent anesthesia.    Exercises        Assessment/Plan    PT Assessment Patient needs continued PT services  PT Diagnosis Difficulty walking   PT Problem List Decreased strength;Decreased range of motion;Decreased activity tolerance;Decreased balance;Decreased mobility;Decreased coordination;Decreased cognition;Decreased knowledge of use of DME;Decreased safety awareness;Decreased knowledge of precautions;Cardiopulmonary status limiting activity;Decreased skin integrity;Other (comment)  PT Treatment Interventions DME instruction;Gait training;Functional mobility training;Therapeutic activities;Therapeutic exercise;Balance training;Neuromuscular re-education;Patient/family education   PT Goals (Current goals can be found in the Care Plan section) Acute Rehab PT Goals Patient Stated Goal: none stated PT Goal Formulation: With patient/family Time For Goal Achievement: 04/20/14 Potential to Achieve Goals: Good    Frequency 7X/week   Barriers to discharge Decreased caregiver support Pt wishes to return to SNF where she stayed after ORIF on R hip    Co-evaluation               End of Session   Activity Tolerance: Patient limited by lethargy Patient left: in bed;with call bell/phone within reach;with family/visitor present Nurse Communication: Mobility status         Time: 1335-1401 PT Time Calculation (min): 26 min   Charges:   PT Evaluation $Initial PT Evaluation Tier I: 1 Procedure PT Treatments $Therapeutic Activity: 8-22 mins   PT G Codes:          Ramond Dial May 05, 2014, 2:28 PM  Mee Hives, PT MS Acute Rehab Dept. Number: [phone]

## 2014-04-06 NOTE — Transfer of Care (Signed)
Immediate Anesthesia Transfer of Care Note  Patient: Kayla Carr  Procedure(s) Performed: Procedure(s): TOTAL HIP ARTHROPLASTY (Right) HARDWARE REMOVAL (Right)  Patient Location: PACU  Anesthesia Type:General  Level of Consciousness: awake, alert  and oriented  Airway & Oxygen Therapy: Patient Spontanous Breathing and Patient connected to nasal cannula oxygen  Post-op Assessment: Report given to PACU RN and Post -op Vital signs reviewed and stable  Post vital signs: Reviewed and stable  Complications: No apparent anesthesia complications

## 2014-04-06 NOTE — Anesthesia Preprocedure Evaluation (Addendum)
Anesthesia Evaluation  Patient identified by MRN, date of birth, ID band Patient awake    Reviewed: Allergy & Precautions, H&P , NPO status , Patient's Chart, lab work & pertinent test results  History of Anesthesia Complications Negative for: history of anesthetic complications  Airway Mallampati: II TM Distance: >3 FB Neck ROM: Full    Dental  (+) Dental Advisory Given, Teeth Intact, Caps   Pulmonary neg pulmonary ROS,  breath sounds clear to auscultation        Cardiovascular hypertension, Pt. on medications - anginaRhythm:Regular Rate:Normal     Neuro/Psych  Headaches, Depression    GI/Hepatic negative GI ROS, Neg liver ROS,   Endo/Other  negative endocrine ROS  Renal/GU negative Renal ROS     Musculoskeletal   Abdominal   Peds  Hematology negative hematology ROS (+)   Anesthesia Other Findings   Reproductive/Obstetrics                        Anesthesia Physical Anesthesia Plan  ASA: II  Anesthesia Plan: General   Post-op Pain Management:    Induction: Intravenous  Airway Management Planned: Oral ETT  Additional Equipment:   Intra-op Plan:   Post-operative Plan: Extubation in OR  Informed Consent: I have reviewed the patients History and Physical, chart, labs and discussed the procedure including the risks, benefits and alternatives for the proposed anesthesia with the patient or authorized representative who has indicated his/her understanding and acceptance.   Dental advisory given  Plan Discussed with: CRNA and Surgeon  Anesthesia Plan Comments: (Plan routine monitors, GETA)       Anesthesia Quick Evaluation

## 2014-04-07 ENCOUNTER — Encounter (HOSPITAL_COMMUNITY): Payer: Self-pay | Admitting: Orthopaedic Surgery

## 2014-04-07 LAB — CBC
HEMATOCRIT: 28.6 % — AB (ref 36.0–46.0)
Hemoglobin: 9.7 g/dL — ABNORMAL LOW (ref 12.0–15.0)
MCH: 32.6 pg (ref 26.0–34.0)
MCHC: 33.9 g/dL (ref 30.0–36.0)
MCV: 96 fL (ref 78.0–100.0)
Platelets: 220 10*3/uL (ref 150–400)
RBC: 2.98 MIL/uL — ABNORMAL LOW (ref 3.87–5.11)
RDW: 13 % (ref 11.5–15.5)
WBC: 9.6 10*3/uL (ref 4.0–10.5)

## 2014-04-07 LAB — BASIC METABOLIC PANEL
Anion gap: 10 (ref 5–15)
BUN: 24 mg/dL — AB (ref 6–23)
CALCIUM: 8.4 mg/dL (ref 8.4–10.5)
CO2: 23 mEq/L (ref 19–32)
CREATININE: 0.59 mg/dL (ref 0.50–1.10)
Chloride: 107 mEq/L (ref 96–112)
GFR calc non Af Amer: 80 mL/min — ABNORMAL LOW (ref >90)
Glucose, Bld: 107 mg/dL — ABNORMAL HIGH (ref 70–99)
Potassium: 4.2 mEq/L (ref 3.7–5.3)
Sodium: 140 mEq/L (ref 137–147)

## 2014-04-07 NOTE — Progress Notes (Signed)
Physical Therapy Treatment Patient Details Name: Kayla Carr MRN: [id number] DOB: [birth year] Today's Date: 04/07/2014    History of Present Illness      PT Comments    Pt feeling much better today and able to participate in Rx.  Min assist needed for all functional mobility.  Pt ambulated with RW and Min assist to/from the bathroom.  Verbal cues needed for hip precautions.  Follow Up Recommendations  SNF;Supervision/Assistance - 24 hour     Equipment Recommendations  None recommended by PT    Recommendations for Other Services       Precautions / Restrictions Precautions Precautions: Fall;Posterior Hip Precaution Comments: Reviewed 3/3 hip precautions.  Pt independently recalled 1/3. Required Braces or Orthoses: Knee Immobilizer - Right Knee Immobilizer - Right: Other (comment) (when in bed) Restrictions Weight Bearing Restrictions: Yes RLE Weight Bearing: Weight bearing as tolerated    Mobility  Bed Mobility         Supine to sit: Min assist     General bed mobility comments: verbal cues for sequencing and maintaining precautions; physical assist with RLE  Transfers   Equipment used: Rolling walker (2 wheeled) Transfers: Stand Pivot Transfers Sit to Stand: Min assist Stand pivot transfers: Min assist       General transfer comment: verbal cues for sequencing and hand placement  Ambulation/Gait Ambulation/Gait assistance: Min assist Ambulation Distance (Feet): 10 Feet (x 2) Assistive device: Rolling walker (2 wheeled) Gait Pattern/deviations: Step-to pattern Gait velocity: decreased       Stairs            Wheelchair Mobility    Modified Rankin (Stroke Patients Only)       Balance   Sitting-balance support: Bilateral upper extremity supported;Feet supported Sitting balance-Leahy Scale: Fair     Standing balance support: Bilateral upper extremity supported;During functional activity Standing balance-Leahy Scale: Poor                      Cognition Arousal/Alertness: Awake/alert Behavior During Therapy: Flat affect Overall Cognitive Status: Impaired/Different from baseline Area of Impairment: Safety/judgement;Following commands;Problem solving;Awareness     Memory: Decreased recall of precautions;Decreased short-term memory Following Commands: Follows one step commands with increased time Safety/Judgement: Decreased awareness of safety;Decreased awareness of deficits Awareness: Emergent Problem Solving: Slow processing;Difficulty sequencing;Requires verbal cues      Exercises Total Joint Exercises Ankle Circles/Pumps: AROM;Both;10 reps;Supine Quad Sets: AROM;Both;10 reps;Supine Gluteal Sets: AROM;Both;10 reps;Supine Heel Slides: AAROM;Right;10 reps;Supine Hip ABduction/ADduction: AAROM;Right;10 reps;Supine    General Comments        Pertinent Vitals/Pain Pain Assessment: Faces Faces Pain Scale: Hurts little more Pain Location: R hip Pain Intervention(s): Monitored during session;Ice applied    Home Living                      Prior Function            PT Goals (current goals can now be found in the care plan section) Progress towards PT goals: Progressing toward goals    Frequency  7X/week    PT Plan Current plan remains appropriate    Co-evaluation             End of Session Equipment Utilized During Treatment: Gait belt Activity Tolerance: Patient limited by lethargy Patient left: in chair;with call bell/phone within reach;with family/visitor present     Time: 5784-6962 PT Time Calculation (min): 30 min  Charges:  $Gait Training: 8-22 mins $Therapeutic Exercise: 8-22 mins  G Codes:      Lorriane Shire 04/07/2014, 11:46 AM

## 2014-04-07 NOTE — Clinical Social Work Psychosocial (Signed)
Clinical Social Work Department BRIEF PSYCHOSOCIAL ASSESSMENT 04/07/2014  Patient:  Kayla Carr,Kayla Carr     Account Number:  [id number]     Admit date:  04/06/2014  Clinical Social Worker:  VAUGHN,EMILY S, LCSWA  Date/Time:  04/07/2014 03:59 PM  Referred by:  Physician  Date Referred:  04/07/2014 Referred for  SNF Placement   Other Referral:   none.   Interview type:  Patient Other interview type:   Pt's daughter and nephew present at bedside.    PSYCHOSOCIAL DATA Living Status:  ALONE Admitted from facility:   Level of care:   Primary support name:  Susan Roupe Primary support relationship to patient:  CHILD, ADULT Degree of support available:   Strong support system.    CURRENT CONCERNS Current Concerns  Post-Acute Placement   Other Concerns:   none.    SOCIAL WORK ASSESSMENT / PLAN CSW received referral for SNF placement at [facility]. CSW met with pt at bedside to discuss discharge disposition. Pt confirmed discharge plan. CSW provided SNF with clinical documentation for discharge. CSW to continue to follow and assist with discharge planning needs.   Assessment/plan status:  Psychosocial Support/Ongoing Assessment of Needs Other assessment/ plan:   none.   Information/referral to community resources:   Pt to be discharged to [facility].    PATIENT'S/FAMILY'S RESPONSE TO PLAN OF CARE: Pt and pt's family understanding and agreeable to CSW plan of care. Pt and pt's family expressed no further questions or concerns at this time.       Emily Vaughn, LCSWA [phone]) Licensed Clinical Social Worker Orthopedics ([id number]) and Surgical ([id number])  

## 2014-04-07 NOTE — Progress Notes (Signed)
Patient ID: Kayla Carr, female   DOB: 1925-05-25, 80 y.o.   MRN: [id number] PATIENT ID: Kayla Carr        MRN:  [id number]          DOB/AGE: 12/31/1924 / 80 y.o.    Joni Fears, MD   Biagio Borg, PA-C [address] [place], [place]  [address]                             [phone]   PROGRESS NOTE  Subjective:  negative for Chest Pain  negative for Shortness of Breath  negative for Nausea/Vomiting   negative for Calf Pain    Tolerating Diet: yes         Patient reports pain as mild.     BP low overnight but fine now  Objective: Vital signs in last 24 hours:   Patient Vitals for the past 24 hrs:  BP Temp Temp src Pulse Resp SpO2  04/07/14 0438 114/52 mmHg 97.5 F (36.4 C) Oral 72 17 98 %  04/07/14 0400 - - - - 16 96 %  04/07/14 0000 - - - - 14 96 %  04/06/14 2031 92/44 mmHg 100 F (37.8 C) Oral 69 14 96 %  04/06/14 2000 - - - - 14 96 %  04/06/14 1726 107/66 mmHg - - - - -  04/06/14 1405 110/42 mmHg - - - - -  04/06/14 1400 - - - - 16 100 %  04/06/14 1338 102/47 mmHg 98 F (36.7 C) - 63 16 100 %  04/06/14 1126 92/48 mmHg - - - - -  04/06/14 1122 87/45 mmHg 98 F (36.7 C) - 63 14 97 %  04/06/14 1100 - 97.6 F (36.4 C) - 69 12 99 %  04/06/14 1052 109/50 mmHg - - 62 15 99 %  04/06/14 1045 - - - 64 17 99 %  04/06/14 1036 107/52 mmHg - - 68 15 100 %  04/06/14 1030 - - - 64 17 100 %  04/06/14 1022 120/54 mmHg - - 64 16 100 %  04/06/14 1015 - - - 64 15 100 %  04/06/14 1006 137/62 mmHg 98.2 F (36.8 C) - 74 13 100 %      Intake/Output from previous day:   10/20 0701 - 10/21 0700 In: 1855 [P.O.:480; I.V.:1375] Out: 945 [Urine:745]   Intake/Output this shift:       Intake/Output     10/20 0701 - 10/21 0700 10/21 0701 - 10/22 0700   P.O. 480    I.V. (mL/kg) 1375 (16.3)    Total Intake(mL/kg) 1855 (22)    Urine (mL/kg/hr) 745 (0.4)    Blood 200 (0.1)    Total Output 945     Net +910             LABORATORY DATA:  Recent Labs  03/31/14 1619  04/07/14 0350  WBC 7.1 9.6  HGB 13.5 9.7*  HCT 40.3 28.6*  PLT 289 220    Recent Labs  03/31/14 1619 04/07/14 0350  NA 145 140  K 4.0 4.2  CL 106 107  CO2 24 23  BUN 31* 24*  CREATININE 0.57 0.59  GLUCOSE 94 107*  CALCIUM 9.9 8.4   Lab Results  Component Value Date   INR 1.01 03/31/2014    Recent Radiographic Studies :  Chest 2 View  03/31/2014   CLINICAL DATA:  Preop total right hip arthroplasty. History of hypertension.  EXAM: CHEST  2 VIEW  COMPARISON:  None.  FINDINGS: Heart is mildly enlarged. Lungs are clear. No effusions or edema. No acute bony abnormality. Old left clavicle fracture. Degenerative changes in the shoulders and lower thoracic spine. Surgical clips in the left axilla.  IMPRESSION: Cardiomegaly.  No active disease.   Electronically Signed   By: Rolm Baptise M.D.   On: 03/31/2014 16:38   Dg Pelvis Portable  04/06/2014   CLINICAL DATA:  Status post right hip replacement  EXAM: PORTABLE PELVIS 1-2 VIEWS  COMPARISON:  None.  FINDINGS: There are changes consistent with a right hip replacement. The femoral component is well seated within the acetabular component. No acute bony abnormality is seen. Air is noted in the surgical bed.  IMPRESSION: Status post right hip replacement without complicating factors.   Electronically Signed   By: Inez Catalina M.D.   On: 04/06/2014 10:45   Dg Hip Portable 1 View Right  04/06/2014   CLINICAL DATA:  Right hip arthroplasty.  EXAM: PORTABLE RIGHT HIP - 1 VIEW  COMPARISON:  None.  FINDINGS: A single cross-table lateral view of the right hip demonstrates total hip arthroplasty without complicating features.  IMPRESSION: Right total hip arthroplasty without complicating features on the single view.   Electronically Signed   By: Hassan Rowan M.D.   On: 04/06/2014 10:23     Examination:  General appearance: alert, cooperative and no distress  Wound Exam: clean, dry, intact   Drainage:  None: wound tissue dry  Motor Exam: EHL,  FHL, Anterior Tibial and Posterior Tibial Intact  Sensory Exam: Superficial Peroneal, Deep Peroneal and Tibial normal  Vascular Exam: Normal  Assessment:    1 Day Post-Op  Procedure(s) (LRB): TOTAL HIP ARTHROPLASTY (Right) HARDWARE REMOVAL (Right)  ADDITIONAL DIAGNOSIS:  Active Problems:   S/P total hip arthroplasty  Acute Blood Loss Anemia-asymptomatic   Plan: Physical Therapy as ordered Weight Bearing as Tolerated (WBAT)  DVT Prophylaxis:  Xarelto, Foot Pumps and TED hose  DISCHARGE PLAN: Skilled Nursing Facility/Rehab  DISCHARGE NEEDS: HHPT, Walker and 3-in-1 comode seat Foley out, OOB with PT, monitor BP, lab,rehab at  [facility], post op films with excellent position of components       Garald Balding  04/07/2014 7:03 AM

## 2014-04-08 DIAGNOSIS — M1611 Unilateral primary osteoarthritis, right hip: Secondary | ICD-10-CM | POA: Diagnosis present

## 2014-04-08 DIAGNOSIS — T8484XA Pain due to internal orthopedic prosthetic devices, implants and grafts, initial encounter: Secondary | ICD-10-CM | POA: Insufficient documentation

## 2014-04-08 DIAGNOSIS — C50919 Malignant neoplasm of unspecified site of unspecified female breast: Secondary | ICD-10-CM | POA: Diagnosis not present

## 2014-04-08 LAB — CBC
HCT: 28.3 % — ABNORMAL LOW (ref 36.0–46.0)
HEMOGLOBIN: 9.5 g/dL — AB (ref 12.0–15.0)
MCH: 32 pg (ref 26.0–34.0)
MCHC: 33.6 g/dL (ref 30.0–36.0)
MCV: 95.3 fL (ref 78.0–100.0)
PLATELETS: 221 10*3/uL (ref 150–400)
RBC: 2.97 MIL/uL — ABNORMAL LOW (ref 3.87–5.11)
RDW: 12.8 % (ref 11.5–15.5)
WBC: 11.2 10*3/uL — ABNORMAL HIGH (ref 4.0–10.5)

## 2014-04-08 LAB — BASIC METABOLIC PANEL
Anion gap: 10 (ref 5–15)
BUN: 17 mg/dL (ref 6–23)
CALCIUM: 8.4 mg/dL (ref 8.4–10.5)
CO2: 23 mEq/L (ref 19–32)
Chloride: 107 mEq/L (ref 96–112)
Creatinine, Ser: 0.49 mg/dL — ABNORMAL LOW (ref 0.50–1.10)
GFR calc Af Amer: >90 mL/min (ref >90)
GFR, EST NON AFRICAN AMERICAN: 85 mL/min — AB (ref >90)
GLUCOSE: 111 mg/dL — AB (ref 70–99)
Potassium: 3.9 mEq/L (ref 3.7–5.3)
Sodium: 140 mEq/L (ref 137–147)

## 2014-04-08 MED ORDER — OXYCODONE HCL 5 MG PO TABS: 5.0000 mg | ORAL_TABLET | ORAL | Status: AC | PRN

## 2014-04-08 MED ORDER — METHOCARBAMOL 500 MG PO TABS: 500.0000 mg | ORAL_TABLET | Freq: Three times a day (TID) | ORAL | Status: AC | PRN

## 2014-04-08 MED ORDER — ACETAMINOPHEN 325 MG PO TABS: 650.0000 mg | ORAL_TABLET | Freq: Four times a day (QID) | ORAL | Status: AC | PRN

## 2014-04-08 MED ORDER — RIVAROXABAN 10 MG PO TABS: 10.0000 mg | ORAL_TABLET | ORAL | Status: AC

## 2014-04-08 NOTE — Progress Notes (Signed)
Physical Therapy Treatment Patient Details Name: Jakyra Kenealy MRN: [id number] DOB: Oct 17, 1924 Today's Date: 04/08/2014    History of Present Illness      PT Comments    Pt with decreased confusion today and A and O x 4.  Progressing with activity tolerance.  Gait distance of 30 feet but limited by fatigue.  Follow Up Recommendations  SNF;Supervision/Assistance - 24 hour     Equipment Recommendations  None recommended by PT    Recommendations for Other Services       Precautions / Restrictions Precautions Precautions: Fall;Posterior Hip Precaution Comments: Reviewed posterior hip precautions.  Pt independently recalled 2/3. Required Braces or Orthoses: Knee Immobilizer - Right Knee Immobilizer - Right: Other (comment) (in bed) Restrictions RLE Weight Bearing: Weight bearing as tolerated    Mobility  Bed Mobility         Supine to sit: Min assist     General bed mobility comments: verbal cues for sequencing and hip precautions  Transfers   Equipment used: Rolling walker (2 wheeled)   Sit to Stand: Min assist         General transfer comment: verbal cues for hand placement  Ambulation/Gait Ambulation/Gait assistance: Min assist Ambulation Distance (Feet): 30 Feet Assistive device: Rolling walker (2 wheeled) Gait Pattern/deviations: Step-to pattern;Antalgic Gait velocity: decreased       Stairs            Wheelchair Mobility    Modified Rankin (Stroke Patients Only)       Balance                                    Cognition Arousal/Alertness: Awake/alert Behavior During Therapy: WFL for tasks assessed/performed Overall Cognitive Status: Within Functional Limits for tasks assessed                      Exercises Total Joint Exercises Ankle Circles/Pumps: AROM;Both;10 reps;Supine Quad Sets: AROM;Both;10 reps;Supine Gluteal Sets: AROM;Both;10 reps;Supine Heel Slides: AAROM;Right;10 reps;Supine Hip  ABduction/ADduction: AAROM;Right;10 reps;Supine    General Comments        Pertinent Vitals/Pain Pain Assessment: Faces Faces Pain Scale: Hurts little more Pain Location: R hip with weight bearing Pain Intervention(s): Monitored during session    Home Living                      Prior Function            PT Goals (current goals can now be found in the care plan section) Progress towards PT goals: Progressing toward goals    Frequency  7X/week    PT Plan Current plan remains appropriate    Co-evaluation             End of Session Equipment Utilized During Treatment: Gait belt Activity Tolerance: Patient limited by fatigue Patient left: in chair;with call bell/phone within reach;with chair alarm set;with family/visitor present     Time: 9485-4627 PT Time Calculation (min): 25 min  Charges:  $Gait Training: 8-22 mins $Therapeutic Exercise: 8-22 mins                    G Codes:      Lorriane Shire 04/08/2014, 11:14 AM

## 2014-04-08 NOTE — Care Management Note (Signed)
CARE MANAGEMENT NOTE 04/08/2014  Patient:  Kayla Carr, Kayla Carr   Account Number:  [id number]  Date Initiated:  04/08/2014  Documentation initiated by:  Ricki Miller  Subjective/Objective Assessment:   80 yr old female admitted with right hip fracture. Patient under went a right total hip arthroplasty.     Action/Plan:   Patient will go to [facility] in [place]. The social worker has arranged this.   Anticipated DC Date:  04/08/2014   Anticipated DC Plan:  SKILLED NURSING FACILITY  In-house referral  Clinical Social Worker      DC Planning Services  CM consult      [facility] Choice  NA   Choice offered to / List presented to:     DME arranged  NA        [facility] arranged  NA      Status of service:  Completed, signed off Medicare Important Message given?  NA - LOS <3 / Initial given by admissions (If response is "NO", the following Medicare IM given date fields will be blank) Date Medicare IM given:   Medicare IM given by:   Date Additional Medicare IM given:   Additional Medicare IM given by:    Discharge Disposition:  [facility]  Per UR Regulation:  Reviewed for med. necessity/level of care/duration of stay

## 2014-04-08 NOTE — Discharge Summary (Signed)
Joni Fears, MD   Biagio Borg, PA-C [address] [place], [place]  [address]                             [phone]  PATIENT ID: Kayla Carr        MRN:  [id number]          DOB/AGE: [birth year] / 80 y.o.    DISCHARGE SUMMARY  ADMISSION DATE:    04/06/2014 DISCHARGE DATE:   04/08/2014   ADMISSION DIAGNOSIS: SUBCAPITAL FRACTURE WITH PINNING, EXTRUSION OF PINS    DISCHARGE DIAGNOSIS:  SUBCAPITAL FRACTURE WITH PINNING, EXTRUSION OF PINS    ADDITIONAL DIAGNOSIS: Principal Problem:   Osteoarthritis of right hip Active Problems:   S/P total hip arthroplasty   Painful orthopaedic hardware left hip   Breast cancer  Past Medical History  Diagnosis Date  . Hypertension   . Hypercholesterolemia   . Depression   . Wears glasses   . HOH (hard of hearing)   . Seasonal allergies   . Headache     PMH  . Cancer     Breast cancer  . Leg cramps     PROCEDURE: Procedure(s): TOTAL HIP ARTHROPLASTY HARDWARE REMOVAL Right on 04/06/2014  CONSULTS: none     HISTORY: Kayla Carr is a very pleasant 80 year old white female who is seen today for evaluation of her right hip. Apparently Mother's Day last year, she sustained a subcapital fracture of her right hip and went to [place], where she had an in-situ pin fixation by Dr. Owens Shark, the local orthopedist. At that time, she had a very difficult time in rehab and spent 10 weeks at [facility] and states she has never really recooperated. She has been limited in her ambulation and weightbearing on the right leg and she still uses a walker or a cane. She is basically having difficulties with the hip. X-rays and CT scan revealed that the subcapital fracture was healed. However, there is penetration of the screw fixation, of which there were 4, which extends into the joint and into the articular surface of the acetabulum. She has been tried with different narcotics and has difficulties with that particular problem. She has had rather  significant pain and discomfort to the point where she has no other ability to continue to ambulate without having all of this pain and discomfort   HOSPITAL COURSE:  Thereasa Iannello is a 80 y.o. admitted on 04/06/2014 and found to have a diagnosis of Kayla Carr.  After appropriate laboratory studies were obtained  they were taken to the operating room on 04/06/2014 and underwent  Procedure(s): TOTAL HIP ARTHROPLASTY HARDWARE REMOVAL  Right.   They were given perioperative antibiotics:  Anti-infectives   Start     Dose/Rate Route Frequency Ordered Stop   04/06/14 1400  ceFAZolin (ANCEF) IVPB 2 g/50 mL premix     2 g 100 mL/hr over 30 Minutes Intravenous Every 6 hours 04/06/14 1133 04/06/14 2042   04/06/14 0600  ceFAZolin (ANCEF) IVPB 2 g/50 mL premix     2 g 100 mL/hr over 30 Minutes Intravenous On call to O.R. 04/05/14 1354 04/06/14 0745    .  Tolerated the procedure well.  Placed with a foley intraoperatively.    Toradol was given post op.  POD #1, allowed out of bed to a chair.  PT for ambulation and exercise program.  Foley D/C'd in morning.  IV saline locked.  O2 discontionued.  POD #2, continued PT and ambulation.  .  The remainder of the hospital course was dedicated to ambulation and strengthening.   The patient was discharged on 2 Days Post-Op in  Stable condition.  Blood products given:none  DIAGNOSTIC STUDIES: Recent vital signs: Patient Vitals for the past 24 hrs:  BP Temp Temp src Pulse Resp SpO2 Height  04/08/14 1003 111/55 mmHg - - - - - -  04/08/14 0622 - 99.7 F (37.6 C) Oral - - - -  04/08/14 0508 123/56 mmHg 100.8 F (38.2 C) Oral 76 17 96 % -  04/07/14 2345 - - - - 16 - -  04/07/14 2010 126/51 mmHg 99.2 F (37.3 C) Oral 85 20 94 % -  04/07/14 1700 - - - - - - 5' 8.11" (1.73 m)  04/07/14 1434 118/52 mmHg 97.3 F (36.3 C) Oral 68 17 99 % -       Recent laboratory studies:  Recent Labs  04/07/14 0350  04/08/14 0700  WBC 9.6 11.2*  HGB 9.7* 9.5*  HCT 28.6* 28.3*  PLT 220 221    Recent Labs  04/07/14 0350 04/08/14 0700  NA 140 140  K 4.2 3.9  CL 107 107  CO2 23 23  BUN 24* 17  CREATININE 0.59 0.49*  GLUCOSE 107* 111*  CALCIUM 8.4 8.4   Lab Results  Component Value Date   INR 1.01 03/31/2014     Recent Radiographic Studies :  Chest 2 View  03/31/2014   CLINICAL DATA:  Preop total right hip arthroplasty. History of hypertension.  EXAM: CHEST  2 VIEW  COMPARISON:  None.  FINDINGS: Heart is mildly enlarged. Lungs are clear. No effusions or edema. No acute bony abnormality. Old left clavicle fracture. Degenerative changes in the shoulders and lower thoracic spine. Surgical clips in the left axilla.  IMPRESSION: Cardiomegaly.  No active disease.   Electronically Signed   By: Rolm Baptise M.D.   On: 03/31/2014 16:38   Dg Pelvis Portable  04/06/2014   CLINICAL DATA:  Status post right hip replacement  EXAM: PORTABLE PELVIS 1-2 VIEWS  COMPARISON:  None.  FINDINGS: There are changes consistent with a right hip replacement. The femoral component is well seated within the acetabular component. No acute bony abnormality is seen. Air is noted in the surgical bed.  IMPRESSION: Status post right hip replacement without complicating factors.   Electronically Signed   By: Inez Catalina M.D.   On: 04/06/2014 10:45   Dg Hip Portable 1 View Right  04/06/2014   CLINICAL DATA:  Right hip arthroplasty.  EXAM: PORTABLE RIGHT HIP - 1 VIEW  COMPARISON:  None.  FINDINGS: A single cross-table lateral view of the right hip demonstrates total hip arthroplasty without complicating features.  IMPRESSION: Right total hip arthroplasty without complicating features on the single view.   Electronically Signed   By: Hassan Rowan M.D.   On: 04/06/2014 10:23    DISCHARGE INSTRUCTIONS: Discharge Instructions   Call MD / Call 911    Complete by:  As directed   If you experience chest pain or shortness of breath, CALL  911 and be transported to the hospital emergency room.  If you develop a fever above 101 F, pus (white drainage) or increased drainage or redness at the wound, or calf pain, call your surgeon's office.     Change dressing    Complete by:  As directed   DO NOT CHANGE DRESSING  Constipation Prevention    Complete by:  As directed   Drink plenty of fluids.  Prune juice and/or coffee may be helpful.  You may use a stool softener, such as Colace (over the counter) 100 mg twice a day.  Use MiraLax (over the counter) for constipation as needed but this may take several days to work.  Mag Citrate --OR-- Milk of Magnesia --OR -- Dulcolax pills/suppositories may also be used but follow directions on the label.     Diet general    Complete by:  As directed      Discharge instructions    Complete by:  As directed   YOU WERE GIVEN A DEVICE CALLED AN INCENTIVE SPIROMETER TO HELP YOU TAKE DEEP BREATHS.  PLEASE USE THIS AT LEAST TEN (10) TIMES EVERY 1-2 HOURS EVERY DAY TO PREVENT PNEUMONIA.     Driving restrictions    Complete by:  As directed   No driving for 6 weeks     Follow the hip precautions as taught in Physical Therapy    Complete by:  As directed      Increase activity slowly as tolerated    Complete by:  As directed      Lifting restrictions    Complete by:  As directed   No lifting for 6 weeks     Patient may shower    Complete by:  As directed   You may shower over the brown dressing.     TED hose    Complete by:  As directed   Use stockings (TED hose) for 1-2 weeks on operative leg(s).  You may remove them at night for sleeping. May stop the NON-operative leg stocking when you go home.     Weight bearing as tolerated    Complete by:  As directed            DISCHARGE MEDICATIONS:     Medication List    STOP taking these medications       aspirin EC 81 MG tablet     ibuprofen 800 MG tablet  Commonly known as:  ADVIL,MOTRIN      TAKE these medications        acetaminophen 325 MG tablet  Commonly known as:  TYLENOL  Take 2 tablets (650 mg total) by mouth every 6 (six) hours as needed for mild pain, moderate pain or fever.     amLODipine 5 MG tablet  Commonly known as:  NORVASC  Take 5 mg by mouth at bedtime.     fenofibrate micronized 200 MG capsule  Commonly known as:  LOFIBRA  Take 200 mg by mouth at bedtime.     ferrous sulfate 325 (65 FE) MG tablet  Take 325 mg by mouth 3 (three) times daily.     FLUoxetine 20 MG capsule  Commonly known as:  PROZAC  Take 20 mg by mouth daily.     lisinopril 10 MG tablet  Commonly known as:  PRINIVIL,ZESTRIL  Take 10 mg by mouth daily.     methocarbamol 500 MG tablet  Commonly known as:  ROBAXIN  Take 1 tablet (500 mg total) by mouth every 8 (eight) hours as needed for muscle spasms.     MULTIVITAMIN PO  Take 1 tablet by mouth daily.     oxyCODONE 5 MG immediate release tablet  Commonly known as:  Oxy IR/ROXICODONE  Take 1 tablet (5 mg total) by mouth every 3 (three) hours as needed for breakthrough pain.  potassium chloride 10 MEQ tablet  Commonly known as:  K-DUR,KLOR-CON  Take 10 mEq by mouth daily.     rivaroxaban 10 MG Tabs tablet  Commonly known as:  XARELTO  Take 1 tablet (10 mg total) by mouth daily.     THERA Tabs  Take 1 tablet by mouth daily.     traZODone 50 MG tablet  Commonly known as:  DESYREL  Take 50 mg by mouth at bedtime.        FOLLOW UP VISIT:       Follow-up Information   Follow up with [facility], Vonna Kotyk, MD. Schedule an appointment as soon as possible for a visit on 04/21/2014.   Specialty:  Orthopedic Surgery   Contact information:   [address] [address] [phone]       DISPOSITION:   Skilled Nursing Facility/Rehab  CONDITION:  Stable   Mike Craze. [place], [place] [phone]  04/08/2014 12:47 PM

## 2014-04-08 NOTE — Clinical Social Work Note (Addendum)
Pt to be discharged to [facility] in [place]. Pt notified at bedside.  [place]: [phone] Transportation: Daughter transporting pt.  Lubertha Sayres, [place] [phone]) Licensed Clinical Social Worker Orthopedics [phone]) and Surgical [phone])

## 2014-04-08 NOTE — Clinical Social Work Placement (Signed)
Clinical Social Work Department CLINICAL SOCIAL WORK PLACEMENT NOTE 04/08/2014  Patient:  Kayla Carr, Kayla Carr  Account Number:  [id number] [place] date:  04/06/2014  Clinical Social Worker:  Delrae Sawyers  Date/time:  04/08/2014 01:18 PM  Clinical Social Work is seeking post-discharge placement for this patient at the following level of care:   [place]   (*CSW will update this form in Epic as items are completed)   04/07/2014  Patient/family provided with [place] Department of Clinical Social Work's list of facilities offering this level of care within the geographic area requested by the patient (or if unable, by the patient's family).  04/07/2014  Patient/family informed of their freedom to choose among providers that offer the needed level of care, that participate in Medicare, Medicaid or managed care program needed by the patient, have an available bed and are willing to accept the patient.  04/07/2014  Patient/family informed of [facility]' ownership interest in [facility], as well as of the fact that they are under no obligation to receive care at this facility.  PASARR submitted to EDS on  PASARR number received on   FL2 transmitted to all facilities in geographic area requested by pt/family on  04/07/2014 FL2 transmitted to all facilities within larger geographic area on   Patient informed that his/her managed care company has contracts with or will negotiate with  certain facilities, including the following:     Patient/family informed of bed offers received:  04/08/2014 Patient chooses bed at Other Physician recommends and patient chooses bed at    Patient to be transferred to Other on  04/08/2014 Patient to be transferred to facility by [facility] Patient and family notified of transfer on 04/08/2014 Name of family member notified:  Patient notified at bedside.  The following physician request were entered in Epic:   Additional Comments: Pt to  be discharged to [facility] [place] [facility] in [place].  Lubertha Sayres, [place] [phone]) Licensed Clinical Social Worker Orthopedics [phone]) and Surgical [phone])

## 2016-04-04 ENCOUNTER — Ambulatory Visit (INDEPENDENT_AMBULATORY_CARE_PROVIDER_SITE_OTHER): Payer: Medicare Other | Admitting: Orthopaedic Surgery

## 2016-04-04 DIAGNOSIS — M1611 Unilateral primary osteoarthritis, right hip: Secondary | ICD-10-CM

## 2016-07-04 ENCOUNTER — Ambulatory Visit (INDEPENDENT_AMBULATORY_CARE_PROVIDER_SITE_OTHER): Payer: Medicare Other | Admitting: Orthopaedic Surgery

## 2016-07-11 ENCOUNTER — Ambulatory Visit (INDEPENDENT_AMBULATORY_CARE_PROVIDER_SITE_OTHER): Payer: Medicare Other | Admitting: Orthopaedic Surgery

## 2016-07-11 ENCOUNTER — Encounter (INDEPENDENT_AMBULATORY_CARE_PROVIDER_SITE_OTHER): Payer: Self-pay | Admitting: Orthopaedic Surgery

## 2016-07-11 VITALS — BP 141/74 | HR 52 | Ht 62.0 in | Wt 147.0 lb

## 2016-07-11 DIAGNOSIS — R2231 Localized swelling, mass and lump, right upper limb: Secondary | ICD-10-CM | POA: Diagnosis not present

## 2016-07-11 NOTE — Progress Notes (Signed)
   Office Visit Note   Patient: Kayla Carr           Date of Birth: [birth year]           MRN: [id number] Visit Date: 07/11/2016              Requested by: Lonia Mad, MD No address on file PCP: Lonia Mad, MD  Visit Diagnoses: Ganglion cyst PIP joint right long finger  Plan: Long discussion regarding different treatment options including surgical excision. Patient wishes to proceed without surgery. Suggest having ring removed from that finger  Follow-Up Instructions: No Follow-up on file.   Orders:  No orders of the defined types were placed in this encounter.  No orders of the defined types were placed in this encounter.     Procedures: No procedures performed   Clinical Data: No additional findings.   Subjective: Chief Complaint  Patient presents with  . Right Middle Finger - Pain    Patient presents with a "knot" on her right third finger. She states that she first noticed it at Christmas. She denies any pain. She is having difficulty getting her rings off.   No history of injury or trauma. No pain. No loss of sensibility of function of finger.  Review of Systems   Objective: Vital Signs: There were no vitals taken for this visit.  Physical Exam  Ortho Exam 5-6 mm freely mobile, nontender mass dorsum of the PIP joint of the right long finger. No instability of the PIP joint no loss of motion. No swelling of the digit. Nail intact.Ring not tight over the proximal phalanx but difficulty moving it further distally over the ganglion cyst  Specialty Comments:  No specialty comments available.  Imaging: No results found.   PMFS History: Patient Active Problem List   Diagnosis Date Noted  . Osteoarthritis of right hip 04/08/2014  . Painful orthopaedic hardware right hip 04/08/2014  . Breast cancer ([place]) 04/08/2014  . S/P total hip arthroplasty 04/06/2014   Past Medical History:  Diagnosis Date  . Cancer    Breast cancer  . Depression   .  Headache    PMH  . HOH (hard of hearing)   . Hypercholesterolemia   . Hypertension   . Leg cramps   . Seasonal allergies   . Wears glasses     Family History  Problem Relation Age of Onset  . Hypertension Father   . Aneurysm Father   . Seizures Sister     Past Surgical History:  Procedure Laterality Date  . BREAST SURGERY     lumpectectomy with biopsy  . CATARACT EXTRACTION W/ INTRAOCULAR LENS IMPLANT     B/L  . FRACTURE SURGERY     Right  . HARDWARE REMOVAL Right 04/06/2014   Procedure: HARDWARE REMOVAL;  Surgeon: Garald Balding, MD;  Location: [place];  Service: Orthopedics;  Laterality: Right;  . TONSILLECTOMY    . TOTAL HIP ARTHROPLASTY Right 04/06/2014   Procedure: TOTAL HIP ARTHROPLASTY;  Surgeon: Garald Balding, MD;  Location: [place];  Service: Orthopedics;  Laterality: Right;   Social History   Occupational History  . Not on file.   Social History Main Topics  . Smoking status: Never Smoker  . Smokeless tobacco: Never Used  . Alcohol use Yes     Comment: " rare"  . Drug use: No  . Sexual activity: Not on file

## 2021-10-16 DEATH — deceased
